# Patient Record
Sex: Male | Born: 1985 | Race: White | Hispanic: No | Marital: Married | Smoking: Never smoker
Health system: Southern US, Community
[De-identification: ages and names within clinical notes are randomized; demographics above are authoritative.]

## PROBLEM LIST (undated history)

## (undated) DIAGNOSIS — N200 Calculus of kidney: Secondary | ICD-10-CM

## (undated) DIAGNOSIS — K5792 Diverticulitis of intestine, part unspecified, without perforation or abscess without bleeding: Secondary | ICD-10-CM

## (undated) HISTORY — PX: SHOULDER SURGERY: SHX246

## (undated) HISTORY — PX: TONSILLECTOMY: SUR1361

---
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
## 2005-10-10 ENCOUNTER — Ambulatory Visit (HOSPITAL_COMMUNITY): Admission: RE | Admit: 2005-10-10 | Discharge: 2005-10-10 | Payer: Self-pay | Admitting: Specialist

## 2005-10-20 ENCOUNTER — Encounter: Admission: RE | Admit: 2005-10-20 | Discharge: 2005-10-20 | Payer: Self-pay | Admitting: Specialist

## 2008-10-10 ENCOUNTER — Emergency Department (HOSPITAL_COMMUNITY): Admission: EM | Admit: 2008-10-10 | Discharge: 2008-10-10 | Payer: Self-pay | Admitting: Emergency Medicine

## 2010-05-31 NOTE — Op Note (Signed)
[place]  Patient:    DICKEY, CAAMANO                      MRN: [id number] Proc. Date: 12/09/99 Adm. Date:  [id number] Attending:  Claudina Lick                           Operative Report  PREOPERATIVE DIAGNOSIS:  Inflammatory cyst, right upper neck.  POSTOPERATIVE DIAGNOSIS:  Inflammatory cyst, right upper neck.  PROCEDURE:  Excision of cyst, right upper neck.  SURGEON:  Robert L. Lyman Bishop, M.D.  ANESTHESIA:  Local with sedation.  INDICATION FOR PROCEDURE:  This patient was referred by Dr. Lucila Maine, his primary care physician, for evaluation of a persistent swelling in the right upper neck just below the angle of the mandible.  It was first noted about two months prior to admission.  Patient had "mashed it" and this was followed by marked swelling and tenderness and erythema when he was initially seen in the office.  He had an obvious swelling in his entire right upper neck and retromandibular area.  There were some acneiform changes of the lower facial skin on each side, and there was a 2 x 2 cm cystic mass below the angle of the right mandible with an irregular lumpy type of subcutaneous tissue surrounding the lesion.  It was felt that this probably was an infected sebaceous cyst, but due to the location directly over the parotid, I was concerned that it could have arisen from the parotid.  Therefore, a CT scan was done, which indicated that it was more superficial than the parotid, and patient was admitted for excision.  He was placed on Augmentin and by the time of admission for removal, the mass, had almost disappeared.  It was still a small cystic lesion beneath a fairly wide one-half inch scar.  DESCRIPTION OF PROCEDURE:  With the patient in the recumbent position and after IV sedation, 1% Xylocaine containing 1:100,000 epinephrine was infiltrated in the skin around the lesion and the face and neck prepped  with Betadine and sterile drapes applied.  An elliptical incision was made incorporating the previous scar and in the upper limb just as it penetrated the subcuticular area, there as a release of 1-2 cc of clear, watery fluid. The ellipse of skin was removed.  I could not identify a definite cyst wall. The subcutaneous fat in that area had a very soft, friable, chronically inflamed appearance, and all of this was excised down to the underlying superficial parotid fascia.  The superior and inferior skin flaps were widely undermined, removing all of this diseased-appearing fatty tissue until normal tissue was encountered.  Several small bleeding points were controlled with the Bovie, and the incision then was closed with interrupted 4-0 and 5-0 chromic gut placed in a subcuticular fashion, after which the skin was closed with running 5-0 Surgilon.  Estimated blood loss 1-2 cc.  A lightly compressive dressing is applied.  The patient was given 1 g Ancef IV and he is discharged to remove the dressing tomorrow and follow up in the office in one week for removal of skin sutures. DD:  12/09/99 TD:  12/09/99 Job: [id number]

## 2010-05-31 NOTE — Op Note (Signed)
[place]  Patient:    DEMONTRE, PADIN Visit Number: [id number] MRN: [id number]          Service Type: DSU Location: [facility] Attending Physician:  Claudina Lick Dictated by:   Doris Cheadle. Lyman Bishop, M.D. Proc. Date: 12/31/00 Admit Date:  12/31/2000 Discharge Date: 12/31/2000                             Operative Report  PREOPERATIVE DIAGNOSIS: 1. Chronic tonsillitis. 2. Hypertrophic scarring right upper neck, post infection.  POSTOPERATIVE DIAGNOSIS: 1. Chronic tonsillitis. 2. Hypertrophic scarring right upper neck, post infection.  OPERATION PERFORMED: 1. Tonsillectomy and adenoidectomy. 2. Scar revision.  SURGEON:  Robert L. Lyman Bishop, M.D.  ANESTHESIA:  General.  INDICATIONS FOR PROCEDURE:  This 25 year old white male has has a longstanding history of chronic recurring tonsillitis and in the office was noted to have enlarged, very cryptic tonsils and a tonsillectomy was recommended.  In addition to that, the patient had had recurring sebaceous gland infections in the skin of the right upper neck and excision of the cyst and scarring was carried out but the patient subsequently developed a hypertrophic scar of the skin of the previous incision and a revision was recommended.  DESCRIPTION OF PROCEDURE:  After satisfactory general endotracheal anesthesia had been induced 1% Xylocaine containing 1:100,000 epinephrine was infiltrated in the skin around the scar in the right upper neck.  The skin was prepped with Betadine and sterile drapes applied.  There was a 2.5 cm hypertrophic obliquely horizontal scar beneath the right angle of the mandible.  An elliptical incision was made on either side of the scarring, carried into the subcutaneous tissue.  There was extensive subcutaneous scarring found.  The scarring involving the cuticular portion was excised.  The adjacent skin was undermined with a scalpel and the incision closed  with interrupted inverted 5-0 chromic to the subcutaneous tissues with running 5-0 nylon to the skin.  A Steri-Strips dressing was applied.  A Crowe-Davis mouth gag was then inserted and suspended from the Mayo stand.  Examination showed 4+ enlarged very cryptic tonsils which were quite buried and larger than initially appeared. Also as I retracted the tonsil aside, I could see large amounts of lymphoid tissue in the nasopharynx extending down the lateral gutter on each side. This was so extensive, that I felt an adenoidectomy along with removal of that lymphoid tissue in the lateral gutter was indicated.  This was added to the operative permit, okayed by the patients mother and using curets and punch forceps, the adenoid tissue and that extending behind the posterior tonsil pillar on each side was removed and bleeding controlled with light cautery and packs.  Following this, the enlarged cryptic tonsils were incised by way of electrode dissection with coagulation of vessels, 40 mg of Depo-Medrol was infiltrated into the lower edge of the soft palate.  Observation revealed no evidence of bleeding.  The patient was given 1 gm of Ancef and 8 mg of Decadron IV intraoperatively.  Estimated blood loss for this procedure was 75 cc.  The patient tolerated the procedure well, was awakened from anesthesia and taken to the recovery room in satisfactory condition. Dictated by:   Doris Cheadle. Lyman Bishop, M.D. Attending Physician:  Claudina Lick DD:  12/31/00 TD:  01/01/01 Job: [id number]

## 2011-09-01 ENCOUNTER — Inpatient Hospital Stay (HOSPITAL_COMMUNITY)
Admission: EM | Admit: 2011-09-01 | Discharge: 2011-09-05 | DRG: 392 | Disposition: A | Discharge status: Home / Self Care | Payer: MEDICAID | Attending: Internal Medicine | Admitting: Internal Medicine

## 2011-09-01 ENCOUNTER — Encounter (HOSPITAL_COMMUNITY): Payer: Self-pay | Admitting: Emergency Medicine

## 2011-09-01 ENCOUNTER — Emergency Department (HOSPITAL_COMMUNITY): Payer: Self-pay

## 2011-09-01 DIAGNOSIS — F17200 Nicotine dependence, unspecified, uncomplicated: Secondary | ICD-10-CM | POA: Diagnosis present

## 2011-09-01 DIAGNOSIS — K5732 Diverticulitis of large intestine without perforation or abscess without bleeding: Secondary | ICD-10-CM

## 2011-09-01 DIAGNOSIS — K5792 Diverticulitis of intestine, part unspecified, without perforation or abscess without bleeding: Secondary | ICD-10-CM

## 2011-09-01 DIAGNOSIS — R1032 Left lower quadrant pain: Secondary | ICD-10-CM

## 2011-09-01 HISTORY — DX: Calculus of kidney: N20.0

## 2011-09-01 LAB — CBC WITH DIFFERENTIAL/PLATELET
Eosinophils Relative: 1 % (ref 0–5)
HCT: 45.2 % (ref 39.0–52.0)
Hemoglobin: 15.9 g/dL (ref 13.0–17.0)
Lymphocytes Relative: 13 % (ref 12–46)
MCHC: 35.2 g/dL (ref 30.0–36.0)
MCV: 88.5 fL (ref 78.0–100.0)
Monocytes Absolute: 1.8 10*3/uL — ABNORMAL HIGH (ref 0.1–1.0)
Monocytes Relative: 8 % (ref 3–12)
Neutro Abs: 17 10*3/uL — ABNORMAL HIGH (ref 1.7–7.7)
WBC: 21.7 10*3/uL — ABNORMAL HIGH (ref 4.0–10.5)

## 2011-09-01 LAB — URINALYSIS, ROUTINE W REFLEX MICROSCOPIC
Bilirubin Urine: NEGATIVE
Hgb urine dipstick: NEGATIVE
Specific Gravity, Urine: 1.014 (ref 1.005–1.030)
pH: 7.5 (ref 5.0–8.0)

## 2011-09-01 LAB — COMPREHENSIVE METABOLIC PANEL
ALT: 17 U/L (ref 0–53)
Albumin: 4.1 g/dL (ref 3.5–5.2)
Calcium: 10.1 mg/dL (ref 8.4–10.5)
GFR calc Af Amer: >90 mL/min (ref >90)
Glucose, Bld: 114 mg/dL — ABNORMAL HIGH (ref 70–99)
Potassium: 4 mEq/L (ref 3.5–5.1)
Sodium: 138 mEq/L (ref 135–145)
Total Protein: 7.8 g/dL (ref 6.0–8.3)

## 2011-09-01 MED ORDER — ONDANSETRON HCL 4 MG/2ML IJ SOLN
4.0000 mg | Freq: Once | INTRAMUSCULAR | Status: AC
  Administered 2011-09-01: 4 mg via INTRAVENOUS
  Filled 2011-09-01: qty 2

## 2011-09-01 MED ORDER — FAMOTIDINE IN NACL 20-0.9 MG/50ML-% IV SOLN
20.0000 mg | Freq: Once | INTRAVENOUS | Status: AC
  Administered 2011-09-01: 20 mg via INTRAVENOUS
  Filled 2011-09-01: qty 50

## 2011-09-01 MED ORDER — METRONIDAZOLE IN NACL 5-0.79 MG/ML-% IV SOLN
500.0000 mg | Freq: Once | INTRAVENOUS | Status: AC
  Administered 2011-09-02: 500 mg via INTRAVENOUS
  Filled 2011-09-01: qty 100

## 2011-09-01 MED ORDER — CIPROFLOXACIN IN D5W 400 MG/200ML IV SOLN
400.0000 mg | Freq: Once | INTRAVENOUS | Status: AC
  Administered 2011-09-01: 400 mg via INTRAVENOUS
  Filled 2011-09-01: qty 200

## 2011-09-01 MED ORDER — IOHEXOL 300 MG/ML  SOLN
100.0000 mL | Freq: Once | INTRAMUSCULAR | Status: AC | PRN
  Administered 2011-09-01: 100 mL via INTRAVENOUS

## 2011-09-01 MED ORDER — MORPHINE SULFATE 4 MG/ML IJ SOLN
4.0000 mg | INTRAMUSCULAR | Status: DC | PRN
  Administered 2011-09-01 – 2011-09-04 (×9): 4 mg via INTRAVENOUS
  Filled 2011-09-01 (×9): qty 1

## 2011-09-01 NOTE — ED Provider Notes (Addendum)
History     CSN: [id number]  Arrival date & time 09/01/11  1941   None     Chief Complaint  Patient presents with  . Abdominal Pain    (Consider location/radiation/quality/duration/timing/severity/associated sxs/prior treatment) HPI 26 year old man without any significant past medical history comes with left lower quadrant pain for the last 24 hours. He was seated watching TV when the pain suddenly started and has been progressively worsening over the last 24 hours. He describes the pain as sharp, constant, severe to the scale of 9/10, radiating down to his left groin. The pain is increased by movement. It is reduced by lying still on his back and drawing his knees up. His appetite has reduced and he had one episode of large vomiting. He has not had a bowel movement for the last 2 days, but he reports passing flatus. No history of rectal bleeding. However, the patient reports passing dark colored urine without hematuria. He denies dysuria, or increased frequency. The patient does not admit to a history of trauma to his abdomen. He reports a history of fever and chills. He has a history of a left kidney stone, which she passed spontaneously 4 years ago.  Past Medical History  Diagnosis Date  . Kidney stone     No past surgical history on file.  No family history on file.  History  Substance Use Topics  . Smoking status: Not on file  . Smokeless tobacco: Not on file  . Alcohol Use: Not on file      Review of Systems  Constitutional: Positive for diaphoresis.  HENT: Negative.   Eyes: Negative.   Respiratory: Negative.   Cardiovascular: Negative.   Gastrointestinal: Positive for nausea. Negative for diarrhea, blood in stool, abdominal distention, anal bleeding and rectal pain.  Genitourinary: Negative for dysuria, urgency, frequency, hematuria, flank pain, decreased urine volume, discharge, penile swelling, scrotal swelling, enuresis, difficulty urinating, genital sores, penile  pain and testicular pain.  Musculoskeletal: Negative.   Skin: Negative.   Neurological: Negative.   Psychiatric/Behavioral: Negative.     Allergies  Review of patient's allergies indicates no known allergies.  Home Medications  No current outpatient prescriptions on file.  BP 127/76  Pulse 93  Temp 99.3 F (37.4 C) (Oral)  Resp 18  SpO2 99%  Physical Exam  Constitutional: He is oriented to person, place, and time. He appears well-developed and well-nourished. He appears distressed.  HENT:  Head: Normocephalic and atraumatic.  Eyes: Conjunctivae and EOM are normal. Pupils are equal, round, and reactive to light.  Neck: Normal range of motion. Neck supple.  Cardiovascular: Normal rate, regular rhythm and intact distal pulses.   No murmur heard. Pulmonary/Chest: Effort normal and breath sounds normal. No respiratory distress. He has no wheezes. He has no rales. He exhibits no tenderness.  Abdominal: Soft. Bowel sounds are normal. He exhibits no shifting dullness, no distension, no pulsatile liver, no fluid wave, no abdominal bruit, no ascites and no mass. There is no hepatosplenomegaly, splenomegaly or hepatomegaly. There is tenderness in the left lower quadrant. There is rebound and guarding. There is no rigidity, no CVA tenderness, no tenderness at McBurney's point and negative Murphy's sign. No hernia. Hernia confirmed negative in the ventral area, confirmed negative in the right inguinal area and confirmed negative in the left inguinal area.    Genitourinary: Testes normal and penis normal. Right testis shows no swelling and no tenderness. Right testis is descended. Cremasteric reflex is not absent on the right side. Left  testis shows no mass and no swelling. Left testis is descended. Cremasteric reflex is not absent on the left side. Circumcised.  Musculoskeletal: Normal range of motion.  Neurological: He is alert and oriented to person, place, and time. He has normal reflexes.    Skin: Skin is warm and dry. He is not diaphoretic.  Psychiatric: He has a normal mood and affect.    ED Course  Procedures (including critical care time)  Labs Reviewed  COMPREHENSIVE METABOLIC PANEL - Abnormal; Notable for the following:    Glucose, Bld 114 (*)     All other components within normal limits  CBC WITH DIFFERENTIAL - Abnormal; Notable for the following:    WBC 21.7 (*)     Neutrophils Relative 78 (*)     Neutro Abs 17.0 (*)     Monocytes Absolute 1.8 (*)     All other components within normal limits  URINALYSIS, ROUTINE W REFLEX MICROSCOPIC   Ct Abdomen Pelvis W Contrast  09/01/2011  *RADIOLOGY REPORT*  Clinical Data: Left lower quadrant pain.  CT ABDOMEN AND PELVIS WITH CONTRAST  Technique:  Multidetector CT imaging of the abdomen and pelvis was performed following the standard protocol during bolus administration of intravenous contrast.  Contrast: OMNIPAQUE IOHEXOL 300 MG/ML  SOLN  Comparison: CT 10/23/2008  Findings: Diverticular changes in the left colon.  There is thickening of the lower left colon with stranding in the pericolonic fat.  In addition, there are several extraluminal gas bubbles posterior to the lower descending colon compatible with micro perforation.  Negative for abscess.  No free fluid.  Mild left lower lobe atelectasis.  No pleural effusion.  Liver and gallbladder are normal.  Pancreas spleen and kidneys are normal.  Negative for bowel obstruction.  Appendix not visualized.  IMPRESSION: Acute colitis in the lower left colon compatible with diverticulitis.  There is stranding in the pericolonic fat as well as extraluminal gas bubbles compatible with micro perforation. Negative for abscess.   Original Report Authenticated By: Camelia Phenes, M.D.      No diagnosis found.    MDM  This is a 26 year old, healthy man, who comes in with left lower quadrant pain for the past 24 hours. Associated symptoms include vomiting, fever and chills. He has a  significant past medical history of a kidney stone on the left side which he passed spontenously. His been referred from [facility] with an elevated white blood cell count of 21.7 and a neutrophilia of 78%. Physical findings include a temperature of 99.3 and significant left lower quadrant tenderness and guarding, with a normal genital examination. Clinical history and physical exam seem to point to a left kidney stone versus colitis. I have ordered Morphine and Zofran. Basic labs have been ordered including a CBC, urinalysis, comprehensive metabolic panel. Apparently, an abdominal CT scan with contrast has also been ordered.  Noted the results of her CT scan and which shows acute colitis with microperforation.   Internal medicine service has been consulted for admission.   Dow Adolph, MD 09/01/11 2300  11:52 PM  Discussed the patient with surgery (Dr Lindie Spruce) who agrees evaluate the patient. Medicine can admit.  Dow Adolph, MD 09/01/11 [phone]

## 2011-09-01 NOTE — ED Notes (Signed)
Pt. Reports abdominal pain starting last night. States N/V this morning. Denies problems with urination, states last BM was Friday.

## 2011-09-01 NOTE — ED Notes (Signed)
Pt states he hasn't had a BM since Friday, 8/16

## 2011-09-01 NOTE — ED Provider Notes (Signed)
I saw and evaluated the patient, reviewed the resident's note and I agree with the findings and plan.  Pt with llq ttp.  Mild guarding and rebound but in no distress. Pt  With a complicated diverticulitis.  Will admit to the hospital for IV abx.    Celene Kras, MD 09/01/11 [phone]

## 2011-09-01 NOTE — ED Notes (Signed)
Sent from [place] urgent care for elevated WBC (18) LLQ abdominal tenderness when palpated.

## 2011-09-02 ENCOUNTER — Encounter (HOSPITAL_COMMUNITY): Payer: Self-pay | Admitting: Internal Medicine

## 2011-09-02 DIAGNOSIS — K5732 Diverticulitis of large intestine without perforation or abscess without bleeding: Principal | ICD-10-CM

## 2011-09-02 DIAGNOSIS — K5792 Diverticulitis of intestine, part unspecified, without perforation or abscess without bleeding: Secondary | ICD-10-CM

## 2011-09-02 LAB — CBC
MCH: 30.4 pg (ref 26.0–34.0)
MCV: 89.9 fL (ref 78.0–100.0)
Platelets: 160 10*3/uL (ref 150–400)
RBC: 4.44 MIL/uL (ref 4.22–5.81)
RDW: 13.2 % (ref 11.5–15.5)

## 2011-09-02 LAB — COMPREHENSIVE METABOLIC PANEL
ALT: 11 U/L (ref 0–53)
AST: 11 U/L (ref 0–37)
Albumin: 3.1 g/dL — ABNORMAL LOW (ref 3.5–5.2)
Alkaline Phosphatase: 58 U/L (ref 39–117)
Chloride: 102 mEq/L (ref 96–112)
Potassium: 3.9 mEq/L (ref 3.5–5.1)
Sodium: 136 mEq/L (ref 135–145)
Total Bilirubin: 0.9 mg/dL (ref 0.3–1.2)

## 2011-09-02 MED ORDER — MOXIFLOXACIN HCL IN NACL 400 MG/250ML IV SOLN
400.0000 mg | INTRAVENOUS | Status: DC
  Administered 2011-09-02: 400 mg via INTRAVENOUS
  Filled 2011-09-02: qty 250

## 2011-09-02 MED ORDER — METRONIDAZOLE IN NACL 5-0.79 MG/ML-% IV SOLN
500.0000 mg | Freq: Three times a day (TID) | INTRAVENOUS | Status: DC
  Administered 2011-09-02 – 2011-09-05 (×10): 500 mg via INTRAVENOUS
  Filled 2011-09-02 (×13): qty 100

## 2011-09-02 MED ORDER — CIPROFLOXACIN IN D5W 400 MG/200ML IV SOLN
400.0000 mg | Freq: Two times a day (BID) | INTRAVENOUS | Status: DC
  Administered 2011-09-02 – 2011-09-04 (×6): 400 mg via INTRAVENOUS
  Filled 2011-09-02 (×8): qty 200

## 2011-09-02 MED ORDER — SODIUM CHLORIDE 0.9 % IV SOLN
INTRAVENOUS | Status: DC
  Administered 2011-09-02 (×3): via INTRAVENOUS

## 2011-09-02 MED ORDER — SODIUM CHLORIDE 0.9 % IJ SOLN
3.0000 mL | Freq: Two times a day (BID) | INTRAMUSCULAR | Status: DC
  Administered 2011-09-02 – 2011-09-03 (×2): 3 mL via INTRAVENOUS

## 2011-09-02 NOTE — Progress Notes (Signed)
Pt admitted to the unit. Pt is alert and oriented. Pt oriented to room, staff, and call bell. Bed in lowest position. Full assessment to Epic. Call bell with in reach. Told to call for assists. Will continue to monitor.  Demarrio Menges E  

## 2011-09-02 NOTE — Progress Notes (Signed)
[facility] PROGRESS NOTE  Angel Rubio ZOX:[id number] DOB: 08-25-1985 DOA: 09/01/2011 PCP: No primary provider on file.  Assessment/Plan: Active Problems:  Diverticulitis  Acute diverticulitis with microperforation-plan to continue IV antibiotics with ciprofloxacin and Flagyl. Diet per surgery. Code Status: Full Family Communication: Mother at bedside Disposition Plan: Back home    Consultants:  [facility]  Procedures:  CT scan abdomen and pelvis  Antibiotics:  Cipro August 19  Flagyl August 19  HPI/Subjective: Pain is slightly better  Objective: Filed Vitals:   09/02/11 0024 09/02/11 0222 09/02/11 0536 09/02/11 1324  BP: 110/63 130/62 100/61 104/58  Pulse: 89 79 81 82  Temp: 99.8 F (37.7 C) 99.7 F (37.6 C) 99.8 F (37.7 C) 99 F (37.2 C)  TempSrc: Oral Oral Oral Oral  Resp: 14 18 20 18   Height:  6\' 1"  (1.854 m)    Weight:  103.4 kg (227 lb 15.3 oz)    SpO2: 97% 94% 94% 96%    Intake/Output Summary (Last 24 hours) at 09/02/11 1438 Last data filed at 09/02/11 1300  Gross per 24 hour  Intake      0 ml  Output      0 ml  Net      0 ml   Filed Weights   09/02/11 0222  Weight: 103.4 kg (227 lb 15.3 oz)    Exam:   General:  Alert and oriented x3  Cardiovascular: Regular rate and rhythm  Respiratory: Clear to auscultation bilaterally  Abdomen: Soft, significant left lower quadrant tenderness  Data Reviewed: Basic Metabolic Panel:  Lab 09/02/11 8119 09/01/11 2005  NA 136 138  K 3.9 4.0  CL 102 100  CO2 27 27  GLUCOSE 102* 114*  BUN 10 10  CREATININE 1.15 1.01  CALCIUM 9.0 10.1  MG -- --  PHOS -- --   Liver Function Tests:  Lab 09/02/11 0645 09/01/11 2005  AST 11 13  ALT 11 17  ALKPHOS 58 74  BILITOT 0.9 1.1  PROT 6.2 7.8  ALBUMIN 3.1* 4.1   No results found for this basename: LIPASE:5,AMYLASE:5 in the last 168 hours No results found for this basename: AMMONIA:5 in the last 168 hours CBC:  Lab  09/02/11 0645 09/01/11 2005  WBC 14.0* 21.7*  NEUTROABS -- 17.0*  HGB 13.5 15.9  HCT 39.9 45.2  MCV 89.9 88.5  PLT 160 202   Cardiac Enzymes: No results found for this basename: CKTOTAL:5,CKMB:5,CKMBINDEX:5,TROPONINI:5 in the last 168 hours BNP (last 3 results) No results found for this basename: PROBNP:3 in the last 8760 hours CBG: No results found for this basename: GLUCAP:5 in the last 168 hours  No results found for this or any previous visit (from the past 240 hour(s)).   Studies: Ct Abdomen Pelvis W Contrast  09/01/2011  *RADIOLOGY REPORT*  Clinical Data: Left lower quadrant pain.  CT ABDOMEN AND PELVIS WITH CONTRAST  Technique:  Multidetector CT imaging of the abdomen and pelvis was performed following the standard protocol during bolus administration of intravenous contrast.  Contrast: OMNIPAQUE IOHEXOL 300 MG/ML  SOLN  Comparison: CT 10/23/2008  Findings: Diverticular changes in the left colon.  There is thickening of the lower left colon with stranding in the pericolonic fat.  In addition, there are several extraluminal gas bubbles posterior to the lower descending colon compatible with micro perforation.  Negative for abscess.  No free fluid.  Mild left lower lobe atelectasis.  No pleural effusion.  Liver and gallbladder are normal.  Pancreas  spleen and kidneys are normal.  Negative for bowel obstruction.  Appendix not visualized.  IMPRESSION: Acute colitis in the lower left colon compatible with diverticulitis.  There is stranding in the pericolonic fat as well as extraluminal gas bubbles compatible with micro perforation. Negative for abscess.   Original Report Authenticated By: Camelia Phenes, M.D.     Scheduled Meds:    . ciprofloxacin  400 mg Intravenous Once  . ciprofloxacin  400 mg Intravenous Q12H  . famotidine (PEPCID) IV  20 mg Intravenous Once  . metronidazole  500 mg Intravenous Once  . metronidazole  500 mg Intravenous Q8H  . ondansetron (ZOFRAN) IV  4 mg  Intravenous Once  . sodium chloride  3 mL Intravenous Q12H  . DISCONTD: moxifloxacin  400 mg Intravenous Q24H   Continuous Infusions:    . sodium chloride 125 mL/hr at 09/02/11 1119    Active Problems:  Diverticulitis    Time spent: 20 minutes    Indiah Heyden  [facility] Pager [phone]. If 8PM-8AM, please contact night-coverage at [email or web address], password [facility] 09/02/2011, 2:38 PM  LOS: 1 day

## 2011-09-02 NOTE — ED Notes (Signed)
Admitting physician at bedside

## 2011-09-02 NOTE — Consult Note (Signed)
Reason for Consult:Acute diverticulitis Referring Physician: Roben Schliep is an 26 y.o. male.  HPI: 4 day history of constipation and abdominal pain.  Has had this pain before, but thought it was a pulled muscle.  This has been getting worse since Friday.  Past Medical History  Diagnosis Date  . Kidney stone     Past Surgical History  Procedure Date  . Shoulder surgery     Right  . Salivary gland surgery 2001    Biopsy=>negative  . Tonsillectomy     Family History  Problem Relation Age of Onset  . Crohn's disease Mother   . Hypertension Father     Social History:  reports that he has never smoked. His smokeless tobacco use includes Chew. He reports that he drinks about 3 ounces of alcohol per week. He reports that he does not use illicit drugs.  Allergies: No Known Allergies  Medications: I have reviewed the patient's current medications.  Results for orders placed during the hospital encounter of 09/01/11 (from the past 48 hour(s))  COMPREHENSIVE METABOLIC PANEL     Status: Abnormal   Collection Time   09/01/11  8:05 PM      Component Value Range Comment   Sodium 138  135 - 145 mEq/L    Potassium 4.0  3.5 - 5.1 mEq/L    Chloride 100  96 - 112 mEq/L    CO2 27  19 - 32 mEq/L    Glucose, Bld 114 (*) 70 - 99 mg/dL    BUN 10  6 - 23 mg/dL    Creatinine, Ser 1.61  0.50 - 1.35 mg/dL    Calcium 09.6  8.4 - 10.5 mg/dL    Total Protein 7.8  6.0 - 8.3 g/dL    Albumin 4.1  3.5 - 5.2 g/dL    AST 13  0 - 37 U/L    ALT 17  0 - 53 U/L    Alkaline Phosphatase 74  39 - 117 U/L    Total Bilirubin 1.1  0.3 - 1.2 mg/dL    GFR calc non Af Amer >90  >90 mL/min    GFR calc Af Amer >90  >90 mL/min   CBC WITH DIFFERENTIAL     Status: Abnormal   Collection Time   09/01/11  8:05 PM      Component Value Range Comment   WBC 21.7 (*) 4.0 - 10.5 K/uL    RBC 5.11  4.22 - 5.81 MIL/uL    Hemoglobin 15.9  13.0 - 17.0 g/dL    HCT 04.5  40.9 - 81.1 %    MCV 88.5  78.0 - 100.0 fL    MCH 31.1  26.0 - 34.0 pg    MCHC 35.2  30.0 - 36.0 g/dL    RDW 91.4  78.2 - 95.6 %    Platelets 202  150 - 400 K/uL    Neutrophils Relative 78 (*) 43 - 77 %    Neutro Abs 17.0 (*) 1.7 - 7.7 K/uL    Lymphocytes Relative 13  12 - 46 %    Lymphs Abs 2.8  0.7 - 4.0 K/uL    Monocytes Relative 8  3 - 12 %    Monocytes Absolute 1.8 (*) 0.1 - 1.0 K/uL    Eosinophils Relative 1  0 - 5 %    Eosinophils Absolute 0.1  0.0 - 0.7 K/uL    Basophils Relative 0  0 - 1 %    Basophils  Absolute 0.0  0.0 - 0.1 K/uL   URINALYSIS, ROUTINE W REFLEX MICROSCOPIC     Status: Normal   Collection Time   09/01/11 10:47 PM      Component Value Range Comment   Color, Urine YELLOW  YELLOW    APPearance CLEAR  CLEAR    Specific Gravity, Urine 1.014  1.005 - 1.030    pH 7.5  5.0 - 8.0    Glucose, UA NEGATIVE  NEGATIVE mg/dL    Hgb urine dipstick NEGATIVE  NEGATIVE    Bilirubin Urine NEGATIVE  NEGATIVE    Ketones, ur NEGATIVE  NEGATIVE mg/dL    Protein, ur NEGATIVE  NEGATIVE mg/dL    Urobilinogen, UA 1.0  0.0 - 1.0 mg/dL    Nitrite NEGATIVE  NEGATIVE    Leukocytes, UA NEGATIVE  NEGATIVE MICROSCOPIC NOT DONE ON URINES WITH NEGATIVE PROTEIN, BLOOD, LEUKOCYTES, NITRITE, OR GLUCOSE <1000 mg/dL.    Ct Abdomen Pelvis W Contrast  09/01/2011  *RADIOLOGY REPORT*  Clinical Data: Left lower quadrant pain.  CT ABDOMEN AND PELVIS WITH CONTRAST  Technique:  Multidetector CT imaging of the abdomen and pelvis was performed following the standard protocol during bolus administration of intravenous contrast.  Contrast: OMNIPAQUE IOHEXOL 300 MG/ML  SOLN  Comparison: CT 10/23/2008  Findings: Diverticular changes in the left colon.  There is thickening of the lower left colon with stranding in the pericolonic fat.  In addition, there are several extraluminal gas bubbles posterior to the lower descending colon compatible with micro perforation.  Negative for abscess.  No free fluid.  Mild left lower lobe atelectasis.  No pleural  effusion.  Liver and gallbladder are normal.  Pancreas spleen and kidneys are normal.  Negative for bowel obstruction.  Appendix not visualized.  IMPRESSION: Acute colitis in the lower left colon compatible with diverticulitis.  There is stranding in the pericolonic fat as well as extraluminal gas bubbles compatible with micro perforation. Negative for abscess.   Original Report Authenticated By: Camelia Phenes, M.D.     Review of Systems  Constitutional: Positive for fever and chills.  HENT: Negative.   Eyes: Negative.   Respiratory: Negative.   Cardiovascular: Negative.   Gastrointestinal: Positive for nausea, vomiting, constipation and blood in stool.  Skin: Negative.   Neurological: Negative.   Endo/Heme/Allergies: Negative.   Psychiatric/Behavioral: Negative.    Blood pressure 110/63, pulse 89, temperature 99.8 F (37.7 C), temperature source Oral, resp. rate 14, SpO2 97.00%. Physical Exam  Constitutional: He is oriented to person, place, and time. He appears well-developed and well-nourished.  HENT:  Head: Normocephalic and atraumatic.  Eyes: Conjunctivae and EOM are normal. Pupils are equal, round, and reactive to light.  Neck: Normal range of motion. Neck supple.  Cardiovascular: Normal rate, regular rhythm and normal heart sounds.   Respiratory: Effort normal and breath sounds normal.  GI: Soft. Bowel sounds are normal. There is tenderness in the left lower quadrant. There is rebound and guarding.  Genitourinary: Penis normal.  Musculoskeletal: Normal range of motion.  Neurological: He is alert and oriented to person, place, and time.  Skin: Skin is warm.  Psychiatric: He has a normal mood and affect. His behavior is normal. Judgment and thought content normal.    Assessment/Plan: Acute diverticulitis with microperforation and significant diverticulosis of the descending and sigmoid colon. Patient has been admitted by medicine.  He does not require an operation at this  time, but we will follow him in the hospital for improvement.  I  would not be surprised if he did not require colectomy sometime in the future because of the extent of his disease.  Cherylynn Ridges 09/02/2011, 1:45 AM

## 2011-09-02 NOTE — Plan of Care (Signed)
Problem: Food- and Nutrition-Related Knowledge Deficit (NB-1.1) Goal: Nutrition education Formal process to instruct or train a patient/client in a skill or to impart knowledge to help patients/clients voluntarily manage or modify food choices and eating behavior to maintain or improve health.  Outcome: Completed/Met Date Met:  09/02/11 RD consulted for diet education for new onset diverticular dz. RD provided pt with "Low Fiber Nutrition Therapy" hand out from the [employer]. RD went over foods to avoid and foods to choose more often. Pt verbalized understanding. No questions.  Current diet is NPO.  RD reviewed chart, no additional nutrition interventions at this time.   Please re-consult if needed  Clarene Duke RD, LDN Pager [phone] After Hours pager [phone]

## 2011-09-02 NOTE — H&P (Signed)
[facility] History and Physical  Angel Rubio YNW:[id number] DOB: Nov 25, 1985 DOA: 09/01/2011  Referring physician: Lynelle Doctor PCP: No primary provider on file.   Chief Complaint: abdominal pain  HPI:  26 year old man without any significant past medical history comes with left lower quadrant pain for the last 24 hours. He was seated watching TV when the pain suddenly started and has been progressively worsening over the last 24 hours. He describes the pain as sharp, constant, severe to the scale of 9/10, radiating down to his left groin. The pain is increased by movement. It is reduced by lying still on his back and drawing his knees up. His appetite has reduced and he had one episode of large vomiting. He has not had a bowel movement for the last 2 days, but he reports passing flatus. No history of rectal bleeding. However, the patient reports passing dark colored urine without hematuria. He denies dysuria, or increased frequency. The patient does not admit to a history of trauma to his abdomen. He reports a history of fever and chills. He has a history of a left kidney stone, which she passed spontaneously 4 years ago.  In ED CT scan abd/pelvis =>diverticulitis with microperforation.   Pt placed on iv flagyl and iv avelox.  Surgery has been consulted by Ed. ,  Appreciate their input.   Review of Systems:  NEGATIVE FOR ALL ORGAN SYSTEMS EXCEPT FOR + ABOVE  Past Medical History  Diagnosis Date  . Kidney stone    Past Surgical History  Procedure Date  . Shoulder surgery     Right  . Salivary gland surgery 2001    Biopsy=>negative  . Tonsillectomy    Social History:  reports that he has never smoked. His smokeless tobacco use includes Chew. He reports that he drinks about 3 ounces of alcohol per week. He reports that he does not use illicit drugs. Lives at home,  Able to perform all ADLS No Known Allergies  Family History  Problem Relation Age of Onset  . Crohn's disease  Mother   . Hypertension Father     (be sure to complete)  Prior to Admission medications   Not on File   Physical Exam: Filed Vitals:   09/01/11 1945 09/02/11 0024  BP: 127/76 110/63  Pulse: 93 89  Temp: 99.3 F (37.4 C) 99.8 F (37.7 C)  TempSrc: Oral Oral  Resp: 18 14  SpO2: 99% 97%     General:  Young white male  Eyes: anicteric  ENT: mmm  Neck: no jvd, no bruit, no tm, no adenoapthy  Cardiovascular: rrr s1, s2  Respiratory: ctab  Abdomen: soft, sligthly tender in the left lower quadrant  Skin: no rash, negative cullens sign  Musculoskeletal: wnl  Psychiatric: axox3  Neurologic: nonfocal  Labs on Admission:  Basic Metabolic Panel:  Lab 09/01/11 6578  NA 138  K 4.0  CL 100  CO2 27  GLUCOSE 114*  BUN 10  CREATININE 1.01  CALCIUM 10.1  MG --  PHOS --   Liver Function Tests:  Lab 09/01/11 2005  AST 13  ALT 17  ALKPHOS 74  BILITOT 1.1  PROT 7.8  ALBUMIN 4.1   No results found for this basename: LIPASE:5,AMYLASE:5 in the last 168 hours No results found for this basename: AMMONIA:5 in the last 168 hours CBC:  Lab 09/01/11 2005  WBC 21.7*  NEUTROABS 17.0*  HGB 15.9  HCT 45.2  MCV 88.5  PLT 202   Cardiac Enzymes: No results  found for this basename: CKTOTAL:5,CKMB:5,CKMBINDEX:5,TROPONINI:5 in the last 168 hours  BNP (last 3 results) No results found for this basename: PROBNP:3 in the last 8760 hours CBG: No results found for this basename: GLUCAP:5 in the last 168 hours  Radiological Exams on Admission: Ct Abdomen Pelvis W Contrast  09/01/2011  *RADIOLOGY REPORT*  Clinical Data: Left lower quadrant pain.  CT ABDOMEN AND PELVIS WITH CONTRAST  Technique:  Multidetector CT imaging of the abdomen and pelvis was performed following the standard protocol during bolus administration of intravenous contrast.  Contrast: OMNIPAQUE IOHEXOL 300 MG/ML  SOLN  Comparison: CT 10/23/2008  Findings: Diverticular changes in the left colon.  There  is thickening of the lower left colon with stranding in the pericolonic fat.  In addition, there are several extraluminal gas bubbles posterior to the lower descending colon compatible with micro perforation.  Negative for abscess.  No free fluid.  Mild left lower lobe atelectasis.  No pleural effusion.  Liver and gallbladder are normal.  Pancreas spleen and kidneys are normal.  Negative for bowel obstruction.  Appendix not visualized.  IMPRESSION: Acute colitis in the lower left colon compatible with diverticulitis.  There is stranding in the pericolonic fat as well as extraluminal gas bubbles compatible with micro perforation. Negative for abscess.   Original Report Authenticated By: Camelia Phenes, M.D.     EKG:  Assessment/Plan Active Problems:  Diverticulitis   1. Diverticulitis with microperforation,  NSiv, NPO, Iv avelox, iv flagyl,  Surgery consult  Code Status: Full Family Communication:mother, please Disposition Plan: home  Time spent: 45 minutes  Pearson Grippe [facility] Pager [phone]  If 7PM-7AM, please contact night-coverage [email or web address] Password [facility] 09/02/2011, 12:51 AM

## 2011-09-02 NOTE — Progress Notes (Signed)
Still quite tender RLQ.  Hope to treat non-operatively.  Continue bowel rest and IV ABX. Will follow closely.  I spoke to the patient about the disease process. Patient examined and I agree with the assessment and plan  Violeta Gelinas, MD, MPH, FACS Pager: [phone]  09/02/2011 11:13 AM

## 2011-09-02 NOTE — Progress Notes (Signed)
Patient ID: Angel Rubio, male   DOB: 30-Jan-1985, 26 y.o.   MRN: [id number]    Subjective: Pt reports abd pain about the same.  Denies fevers, chills, nausea or vomiting.  No problems otherwise.  Objective: Vital signs in last 24 hours: Temp:  [99.3 F (37.4 C)-99.8 F (37.7 C)] 99.8 F (37.7 C) (08/20 0536) Pulse Rate:  [79-93] 81  (08/20 0536) Resp:  [14-20] 20  (08/20 0536) BP: (100-130)/(61-76) 100/61 mmHg (08/20 0536) SpO2:  [94 %-99 %] 94 % (08/20 0536) Weight:  [227 lb 15.3 oz (103.4 kg)] 227 lb 15.3 oz (103.4 kg) (08/20 0222) Last BM Date: 08/30/11  Intake/Output from previous day:   Intake/Output this shift:    PE:  General: awake, alert, no acute distress Heart: RRR Lungs: CTA bilateral Abd: soft, tender esp left side, +bs Ext: warm, well perfused  Lab Results:   Basename 09/02/11 0645 09/01/11 2005  WBC 14.0* 21.7*  HGB 13.5 15.9  HCT 39.9 45.2  PLT 160 202   BMET  Basename 09/02/11 0645 09/01/11 2005  NA 136 138  K 3.9 4.0  CL 102 100  CO2 27 27  GLUCOSE 102* 114*  BUN 10 10  CREATININE 1.15 1.01  CALCIUM 9.0 10.1   PT/INR  Basename 09/02/11 0645  LABPROT 15.4*  INR 1.19   CMP     Component Value Date/Time   NA 136 09/02/2011 0645   K 3.9 09/02/2011 0645   CL 102 09/02/2011 0645   CO2 27 09/02/2011 0645   GLUCOSE 102* 09/02/2011 0645   BUN 10 09/02/2011 0645   CREATININE 1.15 09/02/2011 0645   CALCIUM 9.0 09/02/2011 0645   PROT 6.2 09/02/2011 0645   ALBUMIN 3.1* 09/02/2011 0645   AST 11 09/02/2011 0645   ALT 11 09/02/2011 0645   ALKPHOS 58 09/02/2011 0645   BILITOT 0.9 09/02/2011 0645   GFRNONAA 86* 09/02/2011 0645   GFRAA >90 09/02/2011 0645   Lipase  No results found for this basename: lipase       Studies/Results: Ct Abdomen Pelvis W Contrast  09/01/2011  *RADIOLOGY REPORT*  Clinical Data: Left lower quadrant pain.  CT ABDOMEN AND PELVIS WITH CONTRAST  Technique:  Multidetector CT imaging of the abdomen and pelvis was performed  following the standard protocol during bolus administration of intravenous contrast.  Contrast: OMNIPAQUE IOHEXOL 300 MG/ML  SOLN  Comparison: CT 10/23/2008  Findings: Diverticular changes in the left colon.  There is thickening of the lower left colon with stranding in the pericolonic fat.  In addition, there are several extraluminal gas bubbles posterior to the lower descending colon compatible with micro perforation.  Negative for abscess.  No free fluid.  Mild left lower lobe atelectasis.  No pleural effusion.  Liver and gallbladder are normal.  Pancreas spleen and kidneys are normal.  Negative for bowel obstruction.  Appendix not visualized.  IMPRESSION: Acute colitis in the lower left colon compatible with diverticulitis.  There is stranding in the pericolonic fat as well as extraluminal gas bubbles compatible with micro perforation. Negative for abscess.   Original Report Authenticated By: Camelia Phenes, M.D.     Anti-infectives: Anti-infectives     Start     Dose/Rate Route Frequency Ordered Stop   09/02/11 0800   metroNIDAZOLE (FLAGYL) IVPB 500 mg        500 mg 100 mL/hr over 60 Minutes Intravenous Every 8 hours 09/02/11 0214     09/02/11 0300   moxifloxacin (AVELOX) IVPB  400 mg        400 mg 250 mL/hr over 60 Minutes Intravenous Every 24 hours 09/02/11 0214     09/01/11 2315   metroNIDAZOLE (FLAGYL) IVPB 500 mg        500 mg 100 mL/hr over 60 Minutes Intravenous  Once 09/01/11 2302 09/02/11 0118   09/01/11 2315   ciprofloxacin (CIPRO) IVPB 400 mg        400 mg 200 mL/hr over 60 Minutes Intravenous  Once 09/01/11 2302 09/02/11 0014           Assessment/Plan  1.  Diverticulitis with microperforation: no surgical intervention at this time but likely will need colectomy in future, continue management with bowel rest and IV antibiotics.  Will consult dietician to counsel patient about diet.  Will follow.   LOS: 1 day    Ticara Waner 09/02/2011

## 2011-09-03 LAB — CBC
HCT: 39.4 % (ref 39.0–52.0)
Hemoglobin: 13.7 g/dL (ref 13.0–17.0)
MCH: 31.2 pg (ref 26.0–34.0)
MCHC: 34.8 g/dL (ref 30.0–36.0)
MCV: 89.7 fL (ref 78.0–100.0)
Platelets: 176 10*3/uL (ref 150–400)
RBC: 4.39 MIL/uL (ref 4.22–5.81)
RDW: 12.9 % (ref 11.5–15.5)
WBC: 14.3 10*3/uL — ABNORMAL HIGH (ref 4.0–10.5)

## 2011-09-03 LAB — BASIC METABOLIC PANEL
BUN: 11 mg/dL (ref 6–23)
CO2: 25 mEq/L (ref 19–32)
Calcium: 9.2 mg/dL (ref 8.4–10.5)
Chloride: 97 mEq/L (ref 96–112)
Creatinine, Ser: 1.1 mg/dL (ref 0.50–1.35)
GFR calc Af Amer: >90 mL/min (ref >90)
GFR calc non Af Amer: >90 mL/min (ref >90)
Glucose, Bld: 97 mg/dL (ref 70–99)
Potassium: 3.5 mEq/L (ref 3.5–5.1)
Sodium: 135 mEq/L (ref 135–145)

## 2011-09-03 NOTE — Progress Notes (Signed)
Patient ID: Angel Rubio, male   DOB: 09-03-1985, 26 y.o.   MRN: [id number]    Subjective: Pt reports pain a little better today, no other changes, denies fevers/chills.  Objective: Vital signs in last 24 hours: Temp:  [99 F (37.2 C)-100 F (37.8 C)] 99.4 F (37.4 C) (08/21 0512) Pulse Rate:  [77-104] 77  (08/21 0512) Resp:  [16-18] 16  (08/21 0512) BP: (104-119)/(58-75) 119/69 mmHg (08/21 0512) SpO2:  [94 %-99 %] 94 % (08/21 0512) Weight:  [228 lb 6.3 oz (103.6 kg)] 228 lb 6.3 oz (103.6 kg) (08/21 0512) Last BM Date: 08/30/11  Intake/Output from previous day: 08/20 0701 - 08/21 0700 In: 1925 [I.V.:1925] Out: -  Intake/Output this shift:    PE:  General: awake, alert, no acute distress Heart: RRR Lungs: CTA bilateral Abd: soft, less tender, +bs Ext: warm, well perfused  Lab Results:   Basename 09/03/11 0610 09/02/11 0645  WBC 14.3* 14.0*  HGB 13.7 13.5  HCT 39.4 39.9  PLT 176 160   BMET  Basename 09/03/11 0610 09/02/11 0645  NA 135 136  K 3.5 3.9  CL 97 102  CO2 25 27  GLUCOSE 97 102*  BUN 11 10  CREATININE 1.10 1.15  CALCIUM 9.2 9.0   PT/INR  Basename 09/02/11 0645  LABPROT 15.4*  INR 1.19   CMP     Component Value Date/Time   NA 135 09/03/2011 0610   K 3.5 09/03/2011 0610   CL 97 09/03/2011 0610   CO2 25 09/03/2011 0610   GLUCOSE 97 09/03/2011 0610   BUN 11 09/03/2011 0610   CREATININE 1.10 09/03/2011 0610   CALCIUM 9.2 09/03/2011 0610   PROT 6.2 09/02/2011 0645   ALBUMIN 3.1* 09/02/2011 0645   AST 11 09/02/2011 0645   ALT 11 09/02/2011 0645   ALKPHOS 58 09/02/2011 0645   BILITOT 0.9 09/02/2011 0645   GFRNONAA >90 09/03/2011 0610   GFRAA >90 09/03/2011 0610   Lipase  No results found for this basename: lipase       Studies/Results: Ct Abdomen Pelvis W Contrast  09/01/2011  *RADIOLOGY REPORT*  Clinical Data: Left lower quadrant pain.  CT ABDOMEN AND PELVIS WITH CONTRAST  Technique:  Multidetector CT imaging of the abdomen and pelvis was  performed following the standard protocol during bolus administration of intravenous contrast.  Contrast: OMNIPAQUE IOHEXOL 300 MG/ML  SOLN  Comparison: CT 10/23/2008  Findings: Diverticular changes in the left colon.  There is thickening of the lower left colon with stranding in the pericolonic fat.  In addition, there are several extraluminal gas bubbles posterior to the lower descending colon compatible with micro perforation.  Negative for abscess.  No free fluid.  Mild left lower lobe atelectasis.  No pleural effusion.  Liver and gallbladder are normal.  Pancreas spleen and kidneys are normal.  Negative for bowel obstruction.  Appendix not visualized.  IMPRESSION: Acute colitis in the lower left colon compatible with diverticulitis.  There is stranding in the pericolonic fat as well as extraluminal gas bubbles compatible with micro perforation. Negative for abscess.   Original Report Authenticated By: Camelia Phenes, M.D.     Anti-infectives: Anti-infectives     Start     Dose/Rate Route Frequency Ordered Stop   09/02/11 1130   ciprofloxacin (CIPRO) IVPB 400 mg        400 mg 200 mL/hr over 60 Minutes Intravenous Every 12 hours 09/02/11 0915     09/02/11 0800   metroNIDAZOLE (FLAGYL)  IVPB 500 mg        500 mg 100 mL/hr over 60 Minutes Intravenous Every 8 hours 09/02/11 0214     09/02/11 0300   moxifloxacin (AVELOX) IVPB 400 mg  Status:  Discontinued        400 mg 250 mL/hr over 60 Minutes Intravenous Every 24 hours 09/02/11 0214 09/02/11 0916   09/01/11 2315   metroNIDAZOLE (FLAGYL) IVPB 500 mg        500 mg 100 mL/hr over 60 Minutes Intravenous  Once 09/01/11 2302 09/02/11 0118   09/01/11 2315   ciprofloxacin (CIPRO) IVPB 400 mg        400 mg 200 mL/hr over 60 Minutes Intravenous  Once 09/01/11 2302 09/02/11 0014           Assessment/Plan  1.  Diverticulitis with microperforation: no surgical intervention at this time but likely will need colectomy in future, continue  management with IV antibiotics.  May be able to give clear liquids today.  Likely need a couple more days of IV abx.  Will consult dietician to counsel patient about diet.  Will follow.   LOS: 2 days    Aldon Hengst 09/03/2011

## 2011-09-03 NOTE — Progress Notes (Signed)
[facility] PROGRESS NOTE  Angel Rubio:[id number] DOB: September 21, 1985 DOA: 09/01/2011 PCP: No primary provider on file.  Assessment/Plan:  Diverticulitis: -Acute diverticulitis with microperforation-plan to continue IV antibiotics with ciprofloxacin and Flagyl, clear liq diet. appreciat surgery assistance.  Code Status: Full Family Communication: Mother at bedside Disposition Plan: Back home    Consultants:  [facility]  Procedures:  CT scan abdomen and pelvis  Antibiotics:  Cipro August 19  Flagyl August 19  HPI/Subjective: Pain improved. hungry  Objective: Filed Vitals:   09/02/11 0536 09/02/11 1324 09/02/11 2054 09/03/11 0512  BP: 100/61 104/58 115/75 119/69  Pulse: 81 82 104 77  Temp: 99.8 F (37.7 C) 99 F (37.2 C) 100 F (37.8 C) 99.4 F (37.4 C)  TempSrc: Oral Oral Oral Oral  Resp: 20 18 16 16   Height:      Weight:    103.6 kg (228 lb 6.3 oz)  SpO2: 94% 96% 99% 94%    Intake/Output Summary (Last 24 hours) at 09/03/11 1223 Last data filed at 09/03/11 0900  Gross per 24 hour  Intake   1925 ml  Output      0 ml  Net   1925 ml   Filed Weights   09/02/11 0222 09/03/11 0512  Weight: 103.4 kg (227 lb 15.3 oz) 103.6 kg (228 lb 6.3 oz)    Exam:   General:  Alert and oriented x3  Cardiovascular: Regular rate and rhythm  Respiratory: Clear to auscultation bilaterally  Abdomen: Soft, significant left lower quadrant tenderness  Data Reviewed: Basic Metabolic Panel:  Lab 09/03/11 8119 09/02/11 0645 09/01/11 2005  NA 135 136 138  K 3.5 3.9 4.0  CL 97 102 100  CO2 25 27 27   GLUCOSE 97 102* 114*  BUN 11 10 10   CREATININE 1.10 1.15 1.01  CALCIUM 9.2 9.0 10.1  MG -- -- --  PHOS -- -- --   Liver Function Tests:  Lab 09/02/11 0645 09/01/11 2005  AST 11 13  ALT 11 17  ALKPHOS 58 74  BILITOT 0.9 1.1  PROT 6.2 7.8  ALBUMIN 3.1* 4.1   No results found for this basename: LIPASE:5,AMYLASE:5 in the last 168  hours No results found for this basename: AMMONIA:5 in the last 168 hours CBC:  Lab 09/03/11 0610 09/02/11 0645 09/01/11 2005  WBC 14.3* 14.0* 21.7*  NEUTROABS -- -- 17.0*  HGB 13.7 13.5 15.9  HCT 39.4 39.9 45.2  MCV 89.7 89.9 88.5  PLT 176 160 202   Cardiac Enzymes: No results found for this basename: CKTOTAL:5,CKMB:5,CKMBINDEX:5,TROPONINI:5 in the last 168 hours BNP (last 3 results) No results found for this basename: PROBNP:3 in the last 8760 hours CBG: No results found for this basename: GLUCAP:5 in the last 168 hours  No results found for this or any previous visit (from the past 240 hour(s)).   Studies: Ct Abdomen Pelvis W Contrast  09/01/2011  *RADIOLOGY REPORT*  Clinical Data: Left lower quadrant pain.  CT ABDOMEN AND PELVIS WITH CONTRAST  Technique:  Multidetector CT imaging of the abdomen and pelvis was performed following the standard protocol during bolus administration of intravenous contrast.  Contrast: OMNIPAQUE IOHEXOL 300 MG/ML  SOLN  Comparison: CT 10/23/2008  Findings: Diverticular changes in the left colon.  There is thickening of the lower left colon with stranding in the pericolonic fat.  In addition, there are several extraluminal gas bubbles posterior to the lower descending colon compatible with micro perforation.  Negative for abscess.  No free  fluid.  Mild left lower lobe atelectasis.  No pleural effusion.  Liver and gallbladder are normal.  Pancreas spleen and kidneys are normal.  Negative for bowel obstruction.  Appendix not visualized.  IMPRESSION: Acute colitis in the lower left colon compatible with diverticulitis.  There is stranding in the pericolonic fat as well as extraluminal gas bubbles compatible with micro perforation. Negative for abscess.   Original Report Authenticated By: Camelia Phenes, M.D.     Scheduled Meds:    . ciprofloxacin  400 mg Intravenous Q12H  . metronidazole  500 mg Intravenous Q8H  . sodium chloride  3 mL Intravenous Q12H    Continuous Infusions:    . sodium chloride 20 mL/hr at 09/02/11 1749    Active Problems:  Diverticulitis    Time spent: 20 minutes    FELIZ [name]  [facility] Pager [phone]. If 8PM-8AM, please contact night-coverage at [email or web address], password [email or web address] 09/03/2011, 12:23 PM  LOS: 2 days

## 2011-09-03 NOTE — Progress Notes (Signed)
I reviewed the patient's CT scan with him and his mother. Slow progress. Patient examined and I agree with the assessment and plan  Violeta Gelinas, MD, MPH, FACS Pager: [phone]  09/03/2011 2:51 PM

## 2011-09-03 NOTE — Care Management Note (Signed)
    Page 1 of 1   09/05/2011     11:32:09 AM   CARE MANAGEMENT NOTE 09/05/2011  Patient:  CUNG, MASTERSON   Account Number:  [id number]  Date Initiated:  09/03/2011  Documentation initiated by:  Letha Cape  Subjective/Objective Assessment:   dx divertiuclitis with perforation  admit     Action/Plan:   Anticipated DC Date:  09/05/2011   Anticipated DC Plan:  HOME/SELF CARE      DC Planning Services  CM consult      Choice offered to / List presented to:             Status of service:  Completed, signed off Medicare Important Message given?   (If response is "NO", the following Medicare IM given date fields will be blank) Date Medicare IM given:   Date Additional Medicare IM given:    Discharge Disposition:  HOME/SELF CARE  Per UR Regulation:  Reviewed for med. necessity/level of care/duration of stay  If discussed at Long Length of Stay Meetings, dates discussed:    Comments:  PCP Lucila Maine at [facility]  09/05/11 11:30 Letha Cape RN, BSN [phone] patient for possible  dc today, No needs anticipated.  09/03/11 15:33 Letha Cape RN, BSN [phone] patient lives with room mate, pta independent.  Mom states patient may come and stay with her for a while when he is dc from hospital.  Patient is eligible for med ast if needed, but patient states he can afford his medications.

## 2011-09-04 NOTE — Progress Notes (Signed)
Gradual improvement.  Not requiring as much pain medicine.  Still tender LLQ. Clears. Patient examined and I agree with the assessment and plan  Violeta Gelinas, MD, MPH, FACS Pager: [phone]  09/04/2011 9:19 AM\

## 2011-09-04 NOTE — Progress Notes (Signed)
Patient ID: Angel Rubio, male   DOB: [birth year], 26 y.o.   MRN: [id number]     Subjective: Still with pain but continues to improve every day  Objective: Vital signs in last 24 hours: Temp:  [98.3 F (36.8 C)-98.9 F (37.2 C)] 98.3 F (36.8 C) (08/22 0447) Pulse Rate:  [61-82] 61  (08/22 0447) Resp:  [16-18] 16  (08/22 0447) BP: (104-126)/(71-85) 104/71 mmHg (08/22 0447) SpO2:  [95 %-98 %] 95 % (08/22 0447) Weight:  [225 lb 15.5 oz (102.5 kg)] 225 lb 15.5 oz (102.5 kg) (08/22 0447) Last BM Date: 08/30/11  Intake/Output from previous day: 08/21 0701 - 08/22 0700 In: 118 [P.O.:118] Out: -  Intake/Output this shift:    PE:  General: awake, alert, no acute distress Heart: RRR Lungs: CTA bilateral Abd: soft, less tender, +bs Ext: warm, well perfused  Lab Results:   Basename 09/03/11 0610 09/02/11 0645  WBC 14.3* 14.0*  HGB 13.7 13.5  HCT 39.4 39.9  PLT 176 160   BMET  Basename 09/03/11 0610 09/02/11 0645  NA 135 136  K 3.5 3.9  CL 97 102  CO2 25 27  GLUCOSE 97 102*  BUN 11 10  CREATININE 1.10 1.15  CALCIUM 9.2 9.0   PT/INR  Basename 09/02/11 0645  LABPROT 15.4*  INR 1.19   CMP     Component Value Date/Time   NA 135 09/03/2011 0610   K 3.5 09/03/2011 0610   CL 97 09/03/2011 0610   CO2 25 09/03/2011 0610   GLUCOSE 97 09/03/2011 0610   BUN 11 09/03/2011 0610   CREATININE 1.10 09/03/2011 0610   CALCIUM 9.2 09/03/2011 0610   PROT 6.2 09/02/2011 0645   ALBUMIN 3.1* 09/02/2011 0645   AST 11 09/02/2011 0645   ALT 11 09/02/2011 0645   ALKPHOS 58 09/02/2011 0645   BILITOT 0.9 09/02/2011 0645   GFRNONAA >90 09/03/2011 0610   GFRAA >90 09/03/2011 0610   Lipase  No results found for this basename: lipase       Studies/Results: No results found.  Anti-infectives: Anti-infectives     Start     Dose/Rate Route Frequency Ordered Stop   09/02/11 1130   ciprofloxacin (CIPRO) IVPB 400 mg        400 mg 200 mL/hr over 60 Minutes Intravenous Every 12 hours  09/02/11 0915     09/02/11 0800   metroNIDAZOLE (FLAGYL) IVPB 500 mg        500 mg 100 mL/hr over 60 Minutes Intravenous Every 8 hours 09/02/11 0214     09/02/11 0300   moxifloxacin (AVELOX) IVPB 400 mg  Status:  Discontinued        400 mg 250 mL/hr over 60 Minutes Intravenous Every 24 hours 09/02/11 0214 09/02/11 0916   09/01/11 2315   metroNIDAZOLE (FLAGYL) IVPB 500 mg        500 mg 100 mL/hr over 60 Minutes Intravenous  Once 09/01/11 2302 09/02/11 0118   09/01/11 2315   ciprofloxacin (CIPRO) IVPB 400 mg        400 mg 200 mL/hr over 60 Minutes Intravenous  Once 09/01/11 2302 09/02/11 0014           Assessment/Plan  1.  Diverticulitis with microperforation: no surgical intervention at this time but likely will need colectomy in future, continue management with IV antibiotics. Clear liquids today and slowly advance.  Likely need a couple more days of IV abx then home on PO for 2 weeks.  LOS: 3 days    Angel Rubio 09/04/2011

## 2011-09-04 NOTE — Plan of Care (Signed)
Problem: Phase I Progression Outcomes Goal: Initial discharge plan identified Outcome: Completed/Met Date Met:  09/04/11 To return home

## 2011-09-04 NOTE — Progress Notes (Signed)
[facility] PROGRESS NOTE  Angel Rubio WUJ:[id number] DOB: 14-Nov-1985 DOA: 09/01/2011 PCP: No primary provider on file.  Assessment/Plan:  Diverticulitis: -Acute diverticulitis with microperforation-plan to continue IV antibiotics with ciprofloxacin and Flagyl. -appreciat surgery assistance. -discomfort with diet. agrre to switching back to liq. Code Status: Full Family Communication: Mother at bedside Disposition Plan: Back home    Consultants:  [facility]  Procedures:  CT scan abdomen and pelvis  Antibiotics:  Cipro August 19  Flagyl August 19  HPI/Subjective: Pain improved.   Objective: Filed Vitals:   09/03/11 0512 09/03/11 1349 09/03/11 2035 09/04/11 0447  BP: 119/69 111/75 126/85 104/71  Pulse: 77 82 81 61  Temp: 99.4 F (37.4 C) 98.4 F (36.9 C) 98.9 F (37.2 C) 98.3 F (36.8 C)  TempSrc: Oral Oral Tympanic Oral  Resp: 16 18 16 16   Height:      Weight: 103.6 kg (228 lb 6.3 oz)   102.5 kg (225 lb 15.5 oz)  SpO2: 94% 96% 98% 95%    Intake/Output Summary (Last 24 hours) at 09/04/11 0820 Last data filed at 09/03/11 1832  Gross per 24 hour  Intake    118 ml  Output      0 ml  Net    118 ml   Filed Weights   09/02/11 0222 09/03/11 0512 09/04/11 0447  Weight: 103.4 kg (227 lb 15.3 oz) 103.6 kg (228 lb 6.3 oz) 102.5 kg (225 lb 15.5 oz)    Exam:   General:  Alert and oriented x3  Cardiovascular: Regular rate and rhythm  Respiratory: Clear to auscultation bilaterally  Abdomen: Soft, still tender left lower quadrant tenderness  Data Reviewed: Basic Metabolic Panel:  Lab 09/03/11 9562 09/02/11 0645 09/01/11 2005  NA 135 136 138  K 3.5 3.9 4.0  CL 97 102 100  CO2 25 27 27   GLUCOSE 97 102* 114*  BUN 11 10 10   CREATININE 1.10 1.15 1.01  CALCIUM 9.2 9.0 10.1  MG -- -- --  PHOS -- -- --   Liver Function Tests:  Lab 09/02/11 0645 09/01/11 2005  AST 11 13  ALT 11 17  ALKPHOS 58 74  BILITOT 0.9 1.1  PROT 6.2  7.8  ALBUMIN 3.1* 4.1   No results found for this basename: LIPASE:5,AMYLASE:5 in the last 168 hours No results found for this basename: AMMONIA:5 in the last 168 hours CBC:  Lab 09/03/11 0610 09/02/11 0645 09/01/11 2005  WBC 14.3* 14.0* 21.7*  NEUTROABS -- -- 17.0*  HGB 13.7 13.5 15.9  HCT 39.4 39.9 45.2  MCV 89.7 89.9 88.5  PLT 176 160 202   Cardiac Enzymes: No results found for this basename: CKTOTAL:5,CKMB:5,CKMBINDEX:5,TROPONINI:5 in the last 168 hours BNP (last 3 results) No results found for this basename: PROBNP:3 in the last 8760 hours CBG: No results found for this basename: GLUCAP:5 in the last 168 hours  No results found for this or any previous visit (from the past 240 hour(s)).   Studies: Ct Abdomen Pelvis W Contrast  09/01/2011  *RADIOLOGY REPORT*  Clinical Data: Left lower quadrant pain.  CT ABDOMEN AND PELVIS WITH CONTRAST  Technique:  Multidetector CT imaging of the abdomen and pelvis was performed following the standard protocol during bolus administration of intravenous contrast.  Contrast: OMNIPAQUE IOHEXOL 300 MG/ML  SOLN  Comparison: CT 10/23/2008  Findings: Diverticular changes in the left colon.  There is thickening of the lower left colon with stranding in the pericolonic fat.  In addition, there are  several extraluminal gas bubbles posterior to the lower descending colon compatible with micro perforation.  Negative for abscess.  No free fluid.  Mild left lower lobe atelectasis.  No pleural effusion.  Liver and gallbladder are normal.  Pancreas spleen and kidneys are normal.  Negative for bowel obstruction.  Appendix not visualized.  IMPRESSION: Acute colitis in the lower left colon compatible with diverticulitis.  There is stranding in the pericolonic fat as well as extraluminal gas bubbles compatible with micro perforation. Negative for abscess.   Original Report Authenticated By: Camelia Phenes, M.D.     Scheduled Meds:    . ciprofloxacin  400 mg  Intravenous Q12H  . metronidazole  500 mg Intravenous Q8H  . sodium chloride  3 mL Intravenous Q12H   Continuous Infusions:    . sodium chloride 20 mL/hr at 09/02/11 1749    Active Problems:  Diverticulitis    Time spent: 20 minutes    Angel Rubio [name]  [facility] Pager [phone]. If 8PM-8AM, please contact night-coverage at [email or web address], password [facility] 09/04/2011, 8:20 AM  LOS: 3 days

## 2011-09-05 LAB — CBC
HCT: 41 % (ref 39.0–52.0)
Hemoglobin: 14.3 g/dL (ref 13.0–17.0)
MCH: 30.8 pg (ref 26.0–34.0)
MCHC: 34.9 g/dL (ref 30.0–36.0)
MCV: 88.4 fL (ref 78.0–100.0)
Platelets: 232 10*3/uL (ref 150–400)
RBC: 4.64 MIL/uL (ref 4.22–5.81)
RDW: 12.8 % (ref 11.5–15.5)
WBC: 9.7 10*3/uL (ref 4.0–10.5)

## 2011-09-05 MED ORDER — METRONIDAZOLE 500 MG PO TABS
500.0000 mg | ORAL_TABLET | Freq: Three times a day (TID) | ORAL | Status: DC
  Administered 2011-09-05: 500 mg via ORAL
  Filled 2011-09-05 (×3): qty 1

## 2011-09-05 MED ORDER — CIPROFLOXACIN HCL 500 MG PO TABS: 500.0000 mg | ORAL_TABLET | Freq: Two times a day (BID) | ORAL | Status: AC

## 2011-09-05 MED ORDER — METRONIDAZOLE 500 MG PO TABS: 500.0000 mg | ORAL_TABLET | Freq: Three times a day (TID) | ORAL | Status: AC

## 2011-09-05 MED ORDER — CIPROFLOXACIN HCL 500 MG PO TABS
500.0000 mg | ORAL_TABLET | Freq: Two times a day (BID) | ORAL | Status: DC
  Administered 2011-09-05: 500 mg via ORAL
  Filled 2011-09-05 (×3): qty 1

## 2011-09-05 NOTE — Progress Notes (Signed)
Discharge instructions reviewed with patient verbalized and understands. Skin WNL. Prescriptions given to patient.

## 2011-09-05 NOTE — Progress Notes (Signed)
Patient ID: Angel Rubio, male   DOB: 09/27/1985, 26 y.o.   MRN: [id number] Patient ID: Angel Rubio, male   DOB: [birth year], 26 y.o.   MRN: [id number]     Subjective: Less pain and general discomfort this morning, still with LLQ tenderness. Tolerating advance in diet.   Objective: Vital signs in last 24 hours: Temp:  [97.9 F (36.6 C)-98.4 F (36.9 C)] 97.9 F (36.6 C) (08/23 0537) Pulse Rate:  [76-86] 76  (08/23 0537) Resp:  [16-20] 20  (08/23 0537) BP: (111-118)/(69-79) 111/69 mmHg (08/23 0537) SpO2:  [95 %-97 %] 97 % (08/23 0537) Weight:  [223 lb 15.8 oz (101.6 kg)] 223 lb 15.8 oz (101.6 kg) (08/23 0537) Last BM Date: 09/04/11  Intake/Output from previous day: 08/22 0701 - 08/23 0700 In: 820 [P.O.:360; I.V.:460] Out: -  Intake/Output this shift:    PE: General: awake, alert, no acute distress Heart: RRR, No M/R/G Lungs: CTA bilaterally Abd: soft, +bs, still tender in LLQ Extremities: No edema WBC has trended down to WNL, VSS, afebrile  Lab Results:   [facility] 09/05/11 0620 09/03/11 0610  WBC 9.7 14.3*  HGB 14.3 13.7  HCT 41.0 39.4  PLT 232 176   BMET  Basename 09/03/11 0610  NA 135  K 3.5  CL 97  CO2 25  GLUCOSE 97  BUN 11  CREATININE 1.10  CALCIUM 9.2   PT/INR No results found for this basename: LABPROT:2,INR:2 in the last 72 hours CMP     Component Value Date/Time   NA 135 09/03/2011 0610   K 3.5 09/03/2011 0610   CL 97 09/03/2011 0610   CO2 25 09/03/2011 0610   GLUCOSE 97 09/03/2011 0610   BUN 11 09/03/2011 0610   CREATININE 1.10 09/03/2011 0610   CALCIUM 9.2 09/03/2011 0610   PROT 6.2 09/02/2011 0645   ALBUMIN 3.1* 09/02/2011 0645   AST 11 09/02/2011 0645   ALT 11 09/02/2011 0645   ALKPHOS 58 09/02/2011 0645   BILITOT 0.9 09/02/2011 0645   GFRNONAA >90 09/03/2011 0610   GFRAA >90 09/03/2011 0610   Lipase  No results found for this basename: lipase       Studies/Results: No results found.  Anti-infectives: Anti-infectives    Start     Dose/Rate Route Frequency Ordered Stop   09/02/11 1130   ciprofloxacin (CIPRO) IVPB 400 mg        400 mg 200 mL/hr over 60 Minutes Intravenous Every 12 hours 09/02/11 0915     09/02/11 0800   metroNIDAZOLE (FLAGYL) IVPB 500 mg        500 mg 100 mL/hr over 60 Minutes Intravenous Every 8 hours 09/02/11 0214     09/02/11 0300   moxifloxacin (AVELOX) IVPB 400 mg  Status:  Discontinued        400 mg 250 mL/hr over 60 Minutes Intravenous Every 24 hours 09/02/11 0214 09/02/11 0916   09/01/11 2315   metroNIDAZOLE (FLAGYL) IVPB 500 mg        500 mg 100 mL/hr over 60 Minutes Intravenous  Once 09/01/11 2302 09/02/11 0118   09/01/11 2315   ciprofloxacin (CIPRO) IVPB 400 mg        400 mg 200 mL/hr over 60 Minutes Intravenous  Once 09/01/11 2302 09/02/11 0014           Assessment/Plan   1.  Diverticulitis with microperforation: no surgical intervention at this time but likely will need colectomy in future. 2. Continue management with IV antibiotics.  3.  Full liquids today and slowly advance.   4. Likely need one more day of IV abx then home on PO abx for 2 weeks.       LOS: 4 days    Shanisha Lech 09/05/2011

## 2011-09-05 NOTE — Progress Notes (Signed)
Improving Patient examined and I agree with the assessment and plan  Violeta Gelinas, MD, MPH, FACS Pager: [phone]  09/05/2011 3:45 PM

## 2011-09-05 NOTE — Discharge Summary (Signed)
Physician Discharge Summary  Angel Rubio XBJ:[id number] DOB: 02/01/1985 DOA: 09/01/2011  PCP: No primary provider on file.  Admit date: 09/01/2011 Discharge date: 09/05/2011  Discharge Diagnoses:  Active Problems:  Diverticulitis   Discharge Condition: stable for   Disposition:  -follow up with surgery as an outpatient. Diet:Soft diet Wt Readings from Last 3 Encounters:  09/05/11 101.6 kg (223 lb 15.8 oz)    History of present illness:  26 year old man without any significant past medical history comes with left lower quadrant pain for the last 24 hours. He was seated watching TV when the pain suddenly started and has been progressively worsening over the last 24 hours. He describes the pain as sharp, constant, severe to the scale of 9/10, radiating down to his left groin. The pain is increased by movement. It is reduced by lying still on his back and drawing his knees up. His appetite has reduced and he had one episode of large vomiting. He has not had a bowel movement for the last 2 days, but he reports passing flatus. No history of rectal bleeding. However, the patient reports passing dark colored urine without hematuria. He denies dysuria, or increased frequency. The patient does not admit to a history of trauma to his abdomen. He reports a history of fever and chills. He has a history of a left kidney stone, which she passed spontaneously 4 years ago   Hospital Course:  Acute diverticulitis: He was admitted to the hospital start on IV antibiotics CT scan of the abdomen and pelvis was done that showed acute colitis with microperforation additional results as below. He was started empirically on Cipro and Flagyl. Surgery was consulted they recommended to continue medical management. He was started on a clear liquid diet he tolerated this well was advanced to soft diet his antibiotics were changed oral, he was monitored and  he was tolerating this well so he was discharged in stable  condition. During his hospital stay he had 2 bowel movements. Discharge Exam: Filed Vitals:   09/05/11 0537  BP: 111/69  Pulse: 76  Temp: 97.9 F (36.6 C)  Resp: 20   Filed Vitals:   09/04/11 0447 09/04/11 1441 09/04/11 2058 09/05/11 0537  BP: 104/71 116/70 118/79 111/69  Pulse: 61 81 86 76  Temp: 98.3 F (36.8 C) 98.4 F (36.9 C) 98.4 F (36.9 C) 97.9 F (36.6 C)  TempSrc: Oral Oral Oral Oral  Resp: 16 16 16 20   Height:      Weight: 102.5 kg (225 lb 15.5 oz)   101.6 kg (223 lb 15.8 oz)  SpO2: 95% 95% 95% 97%   General: Awake alert and oriented x3 Cardiovascular: Regular rate and rhythm Respiratory: Good air movement clear to auscultation Abdomen: Positive bowel sounds just mild tenderness no rebound or guarding   Discharge Instructions  Discharge Orders    Future Orders Please Complete By Expires   Diet - low sodium heart healthy      Increase activity slowly        Medication List  As of 09/05/2011 10:44 AM   TAKE these medications         ciprofloxacin 500 MG tablet   Commonly known as: CIPRO   Take 1 tablet (500 mg total) by mouth 2 (two) times daily.      metroNIDAZOLE 500 MG tablet   Commonly known as: FLAGYL   Take 1 tablet (500 mg total) by mouth every 8 (eight) hours.  The results of significant diagnostics from this hospitalization (including imaging, microbiology, ancillary and laboratory) are listed below for reference.    Significant Diagnostic Studies: Ct Abdomen Pelvis W Contrast  09/01/2011  *RADIOLOGY REPORT*  Clinical Data: Left lower quadrant pain.  CT ABDOMEN AND PELVIS WITH CONTRAST  Technique:  Multidetector CT imaging of the abdomen and pelvis was performed following the standard protocol during bolus administration of intravenous contrast.  Contrast: OMNIPAQUE IOHEXOL 300 MG/ML  SOLN  Comparison: CT 10/23/2008  Findings: Diverticular changes in the left colon.  There is thickening of the lower left colon with  stranding in the pericolonic fat.  In addition, there are several extraluminal gas bubbles posterior to the lower descending colon compatible with micro perforation.  Negative for abscess.  No free fluid.  Mild left lower lobe atelectasis.  No pleural effusion.  Liver and gallbladder are normal.  Pancreas spleen and kidneys are normal.  Negative for bowel obstruction.  Appendix not visualized.  IMPRESSION: Acute colitis in the lower left colon compatible with diverticulitis.  There is stranding in the pericolonic fat as well as extraluminal gas bubbles compatible with micro perforation. Negative for abscess.   Original Report Authenticated By: Camelia Phenes, M.D.     Microbiology: No results found for this or any previous visit (from the past 240 hour(s)).   Labs: Basic Metabolic Panel:  Lab 09/03/11 1610 09/02/11 0645 09/01/11 2005  NA 135 136 138  K 3.5 3.9 --  CL 97 102 100  CO2 25 27 27   GLUCOSE 97 102* 114*  BUN 11 10 10   CREATININE 1.10 1.15 1.01  CALCIUM 9.2 9.0 10.1  MG -- -- --  PHOS -- -- --   Liver Function Tests:  Lab 09/02/11 0645 09/01/11 2005  AST 11 13  ALT 11 17  ALKPHOS 58 74  BILITOT 0.9 1.1  PROT 6.2 7.8  ALBUMIN 3.1* 4.1   No results found for this basename: LIPASE:5,AMYLASE:5 in the last 168 hours No results found for this basename: AMMONIA:5 in the last 168 hours CBC:  Lab 09/05/11 0620 09/03/11 0610 09/02/11 0645 09/01/11 2005  WBC 9.7 14.3* 14.0* 21.7*  NEUTROABS -- -- -- 17.0*  HGB 14.3 13.7 13.5 15.9  HCT 41.0 39.4 39.9 45.2  MCV 88.4 89.7 89.9 88.5  PLT 232 176 160 202   Cardiac Enzymes: No results found for this basename: CKTOTAL:5,CKMB:5,CKMBINDEX:5,TROPONINI:5 in the last 168 hours BNP: No components found with this basename: POCBNP:5 CBG: No results found for this basename: GLUCAP:5 in the last 168 hours  Time coordinating discharge: Greater than 35 minutes  Signed:  Marinda Elk  [facility] 09/05/2011,  10:44 AM

## 2013-04-05 IMAGING — CT CT ABD-PELV W/ CM
2 of 4 series · 13 of 42 positions shown, 19 images · IV contrast (omnipaque)
Comparison: CT 10/23/2008

CLINICAL DATA: Left lower quadrant pain.

CT ABDOMEN AND PELVIS WITH CONTRAST
TECHNIQUE: Multidetector CT imaging of the abdomen and pelvis was
performed following the standard protocol during bolus
administration of intravenous contrast.
Contrast: 100mL OMNIPAQUE IOHEXOL 300 MG/ML  SOLN

[Series 2: routine abdomen · axial · 0.75mm/px · z∈[-424,-59]mm · 10 of 88 slices shown, 16 images]
[im 8/88  soft-tissue]
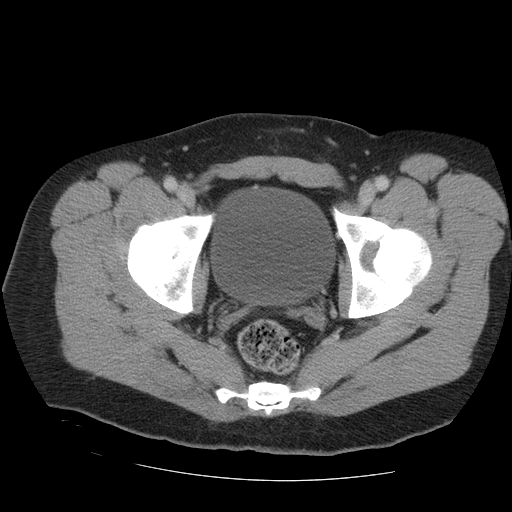
[im 8/88  bone]
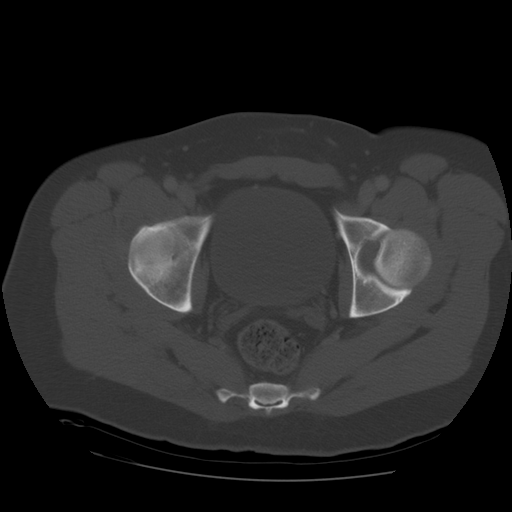
[im 15/88  soft-tissue]
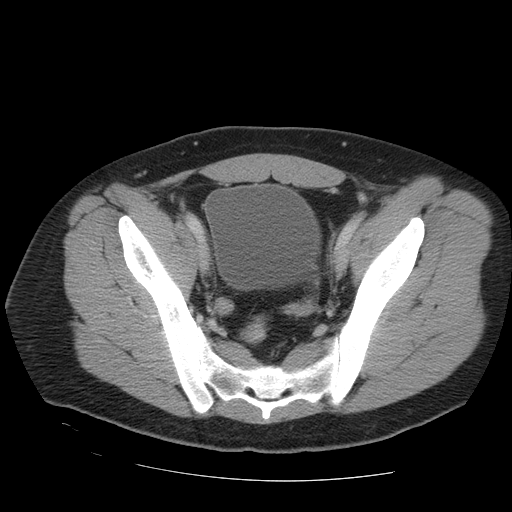
[im 22/88  soft-tissue]
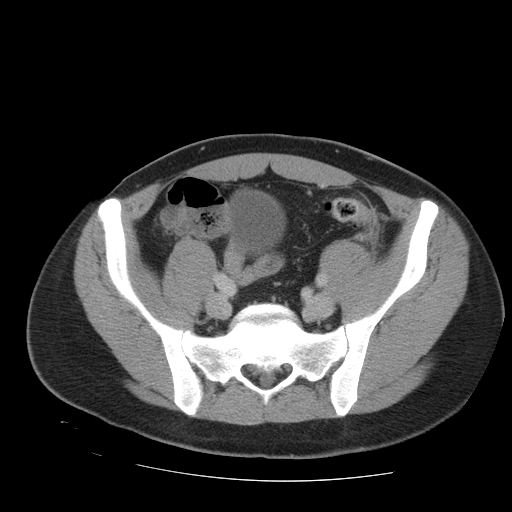
[im 30/88  soft-tissue]
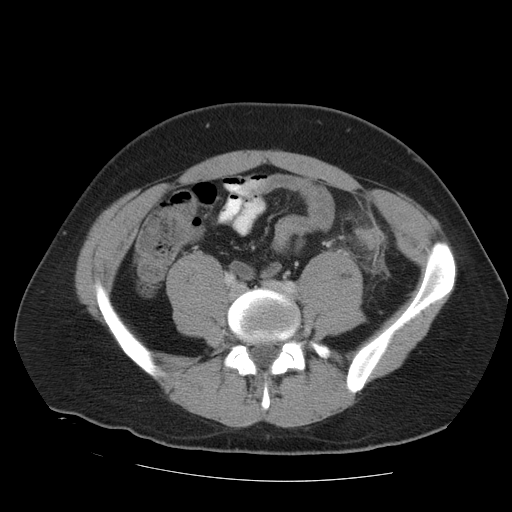
[im 37/88  soft-tissue]
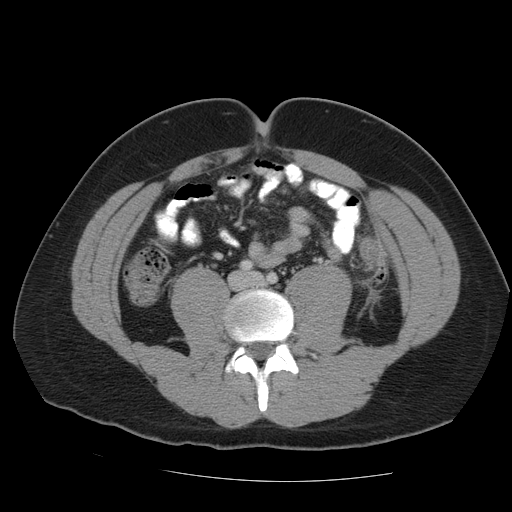
[im 51/88  soft-tissue]
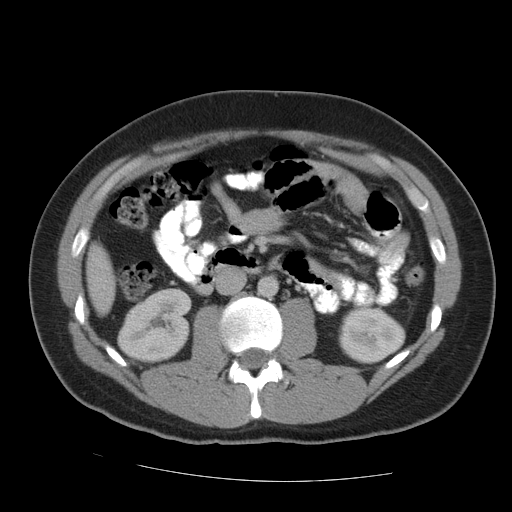
[im 59/88  soft-tissue]
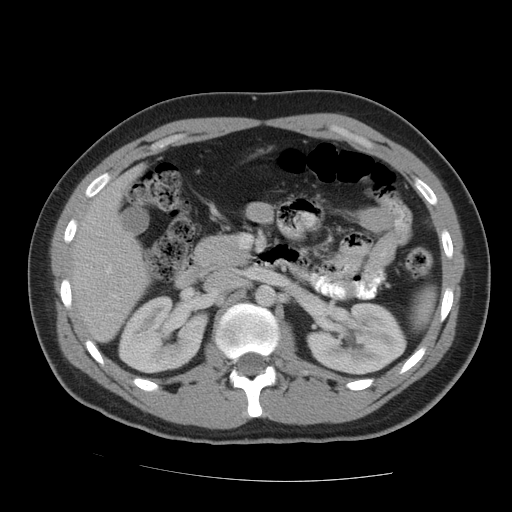
[im 59/88  lung]
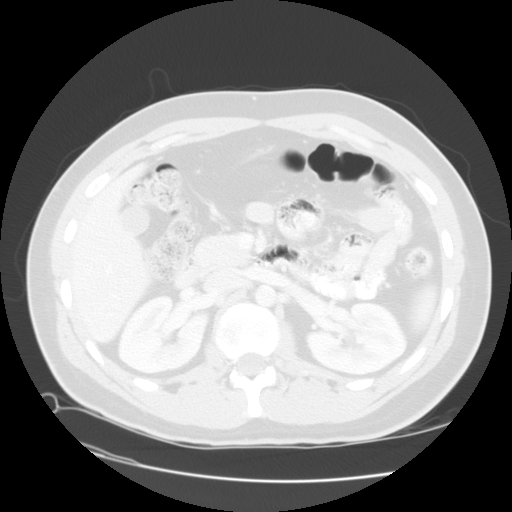
[im 66/88  soft-tissue]
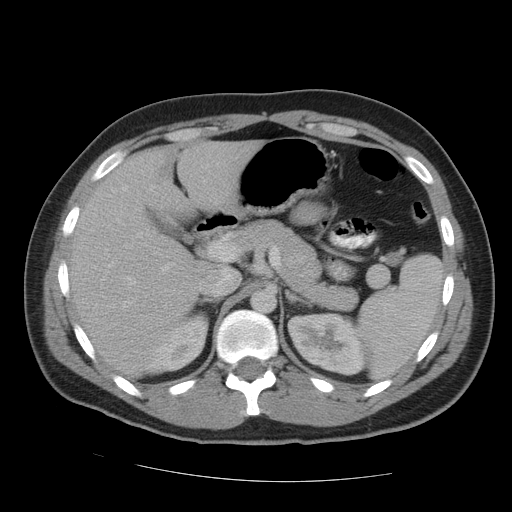
[im 66/88  lung]
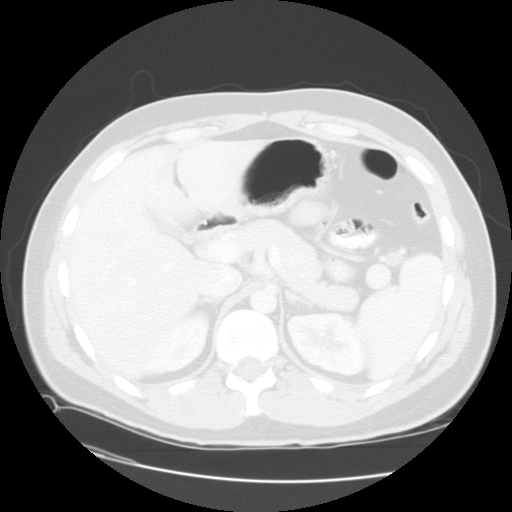
[im 73/88  soft-tissue]
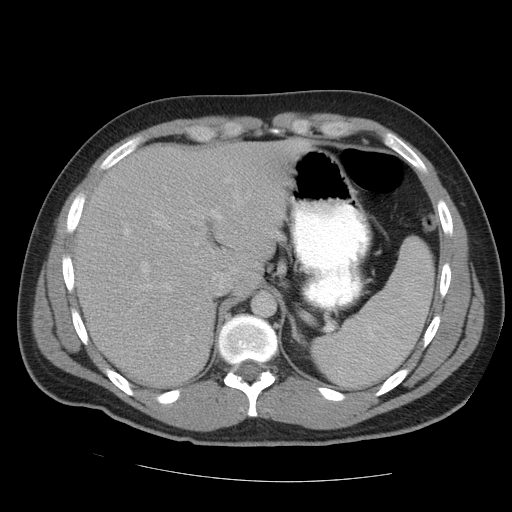
[im 73/88  lung]
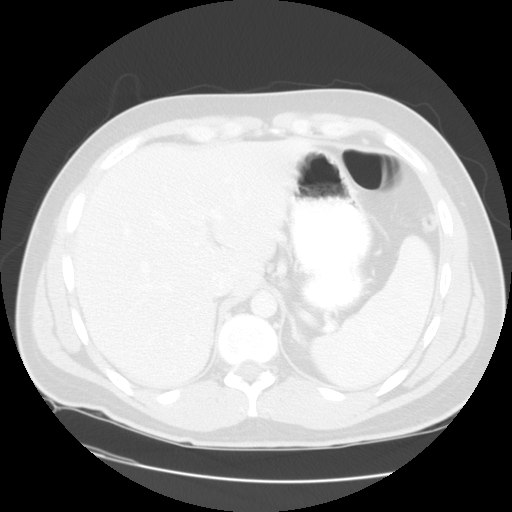
[im 73/88  bone]
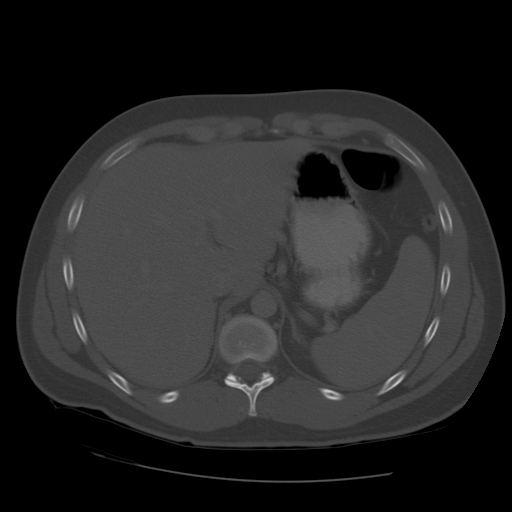
[im 80/88  soft-tissue]
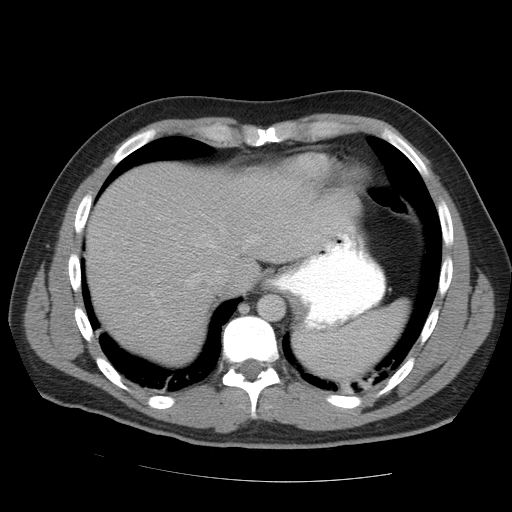
[im 80/88  lung]
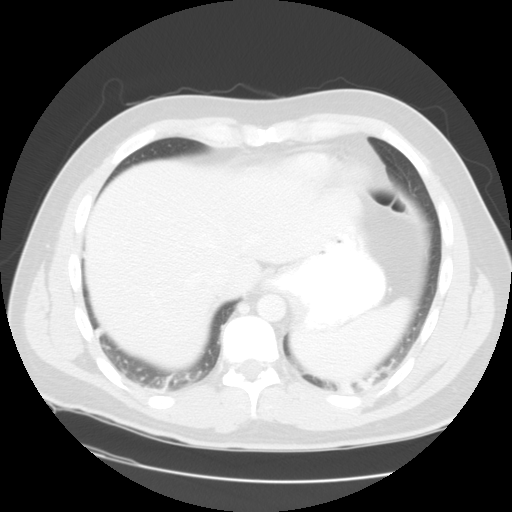

[Series 400: cor · coronal · 1.02mm/px · 3 of 87 slices shown]
[im 29/87  soft-tissue]
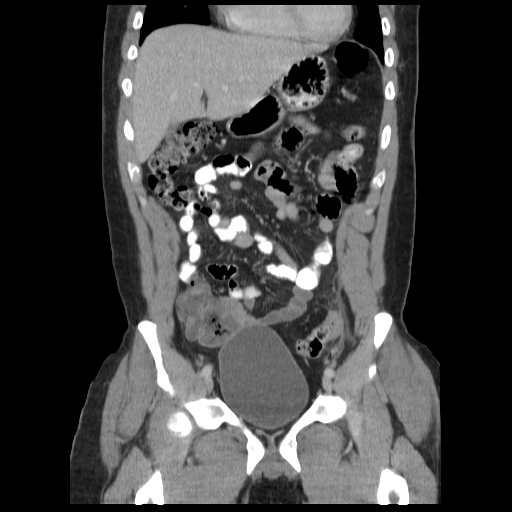
[im 39/87  soft-tissue]
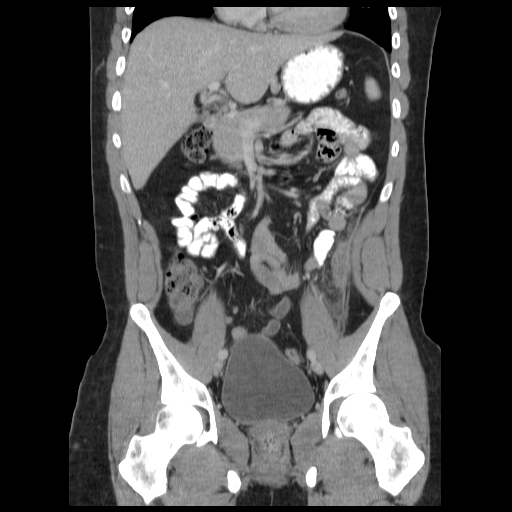
[im 48/87  soft-tissue]
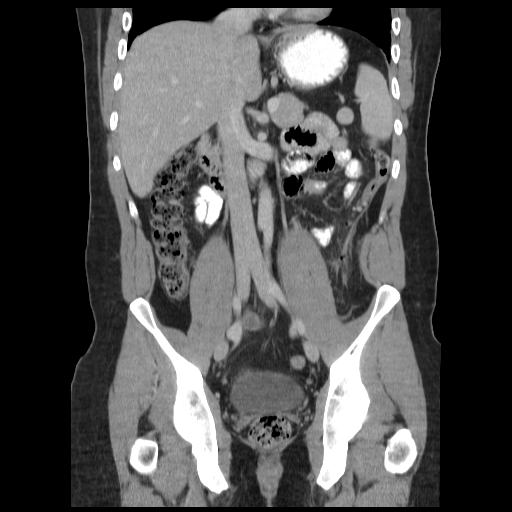

[13 of 42 positions shown; findings below may reference images not displayed]

FINDINGS: Diverticular changes in the left colon.  There is
thickening of the lower left colon with stranding in the
pericolonic fat.  In addition, there are several extraluminal gas
bubbles posterior to the lower descending colon compatible with
micro perforation.  Negative for abscess.  No free fluid.

Mild left lower lobe atelectasis.  No pleural effusion.  Liver and
gallbladder are normal.  Pancreas spleen and kidneys are normal.

Negative for bowel obstruction.  Appendix not visualized.
IMPRESSION: Acute colitis in the lower left colon compatible with
diverticulitis.  There is stranding in the pericolonic fat as well
as extraluminal gas bubbles compatible with micro perforation.
Negative for abscess.

## 2016-10-11 ENCOUNTER — Ambulatory Visit
Admission: EM | Admit: 2016-10-11 | Discharge: 2016-10-11 | Disposition: A | Discharge status: Home / Self Care | Payer: Self-pay | Attending: Family Medicine | Admitting: Family Medicine

## 2016-10-11 DIAGNOSIS — R109 Unspecified abdominal pain: Secondary | ICD-10-CM

## 2016-10-11 DIAGNOSIS — R319 Hematuria, unspecified: Secondary | ICD-10-CM

## 2016-10-11 HISTORY — DX: Diverticulitis of intestine, part unspecified, without perforation or abscess without bleeding: K57.92

## 2016-10-11 LAB — URINALYSIS, COMPLETE (UACMP) WITH MICROSCOPIC
BILIRUBIN URINE: NEGATIVE
Bacteria, UA: NONE SEEN
GLUCOSE, UA: NEGATIVE mg/dL
KETONES UR: 40 mg/dL — AB
Leukocytes, UA: NEGATIVE
NITRITE: NEGATIVE
PH: 7 (ref 5.0–8.0)
Protein, ur: NEGATIVE mg/dL
SQUAMOUS EPITHELIAL / LPF: NONE SEEN
Specific Gravity, Urine: 1.025 (ref 1.005–1.030)
WBC, UA: NONE SEEN WBC/hpf (ref 0–5)

## 2016-10-11 MED ORDER — HYDROCODONE-ACETAMINOPHEN 5-325 MG PO TABS: 2.0000 | ORAL_TABLET | ORAL | 0 refills | Status: AC | PRN

## 2016-10-11 MED ORDER — MELOXICAM 15 MG PO TABS: 15.0000 mg | ORAL_TABLET | Freq: Every day | ORAL | 0 refills | Status: AC | PRN

## 2016-10-11 MED ORDER — ONDANSETRON 8 MG PO TBDP
8.0000 mg | ORAL_TABLET | Freq: Once | ORAL | Status: AC
  Administered 2016-10-11: 8 mg via ORAL

## 2016-10-11 MED ORDER — KETOROLAC TROMETHAMINE 60 MG/2ML IM SOLN
60.0000 mg | Freq: Once | INTRAMUSCULAR | Status: AC
  Administered 2016-10-11: 60 mg via INTRAMUSCULAR

## 2016-10-11 NOTE — ED Triage Notes (Signed)
Pt with hx of kidney stones and states this is consistent with past kidney stones. Left flank pain and vomiting starting this a.m. Pain 3/10

## 2016-10-11 NOTE — ED Provider Notes (Signed)
[facility]    CSN: [id number] Arrival date & time: 10/11/16  1127     History   Chief Complaint Chief Complaint  Patient presents with  . Flank Pain    HPI [name] is a 31 y.o. male.   Patient is a 31 year old male who presents with complaint of kidney stone type pain that began about 8 AM. Patient reports she had a kidney stone last in 2010 his pain is similar to then. Patient reports pain the left lower back radiating around to the front and down to the left testicle. Patient were pain 10 out of 10. Patient lying on a stretcher on his right side with knees drawn. Patient reports not taking any pain medication home. Patient states that his last stone was not tested because he was unable to catch it. Patient reports his last urination was about 8:30-9:00 this AM. He states that he has not been able to drink anything due to the pain.      Past Medical History:  Diagnosis Date  . Diverticulitis   . Kidney stone     Patient Active Problem List   Diagnosis Date Noted  . Diverticulitis 09/02/2011    Past Surgical History:  Procedure Laterality Date  . SALIVARY GLAND SURGERY  2001   Biopsy=>negative  . SHOULDER SURGERY     Right  . TONSILLECTOMY         Home Medications    Prior to Admission medications   Medication Sig Start Date End Date Taking? Authorizing Provider  HYDROcodone-acetaminophen (NORCO/VICODIN) 5-325 MG tablet Take 2 tablets by mouth every 4 (four) hours as needed. 10/11/16   Candis Schatz, PA-C  meloxicam (MOBIC) 15 MG tablet Take 1 tablet (15 mg total) by mouth daily as needed for pain. 10/11/16   Candis Schatz, PA-C    Family History Family History  Problem Relation Age of Onset  . Crohn's disease Mother   . Hypertension Father     Social History Social History  Substance Use Topics  . Smoking status: Never Smoker  . Smokeless tobacco: Current User    Types: Chew  . Alcohol use 3.0 oz/week    6 Standard  drinks or equivalent per week     Comment: social     Allergies   Patient has no known allergies.   Review of Systems Review of Systems  As noted above in history of present illness. Other systems reviewed and found to be negative.   Physical Exam Triage Vital Signs ED Triage Vitals  Enc Vitals Group     BP 10/11/16 1224 130/82     Pulse Rate 10/11/16 1224 60     Resp 10/11/16 1224 18     Temp 10/11/16 1224 97.9 F (36.6 C)     Temp Source 10/11/16 1224 Oral     SpO2 10/11/16 1224 100 %     Weight 10/11/16 1221 235 lb (106.6 kg)     Height 10/11/16 1221 6' (1.829 m)     Head Circumference --      Peak Flow --      Pain Score 10/11/16 1221 3     Pain Loc --      Pain Edu? --      Excl. in GC? --    No data found.   Updated Vital Signs BP 130/82 (BP Location: Left Arm)   Pulse 60   Temp 97.9 F (36.6 C) (Oral)   Resp 18  Ht 6' (1.829 m)   Wt 235 lb (106.6 kg)   SpO2 100%   BMI 31.87 kg/m   Physical Exam  Constitutional: He is oriented to person, place, and time. He appears well-developed and well-nourished.  Moderate distress related to pain. Patient lying on right side with knees drawn.  HENT:  Head: Normocephalic and atraumatic.  Eyes: Pupils are equal, round, and reactive to light. EOM are normal.  Neck: Normal range of motion. Neck supple.  Cardiovascular: Normal rate, regular rhythm and normal heart sounds.  Exam reveals no friction rub.   No murmur heard. Pulmonary/Chest: Effort normal and breath sounds normal.  Abdominal: Soft. Bowel sounds are normal.  Left CVA, flank, and left lower quadrant tenderness that increases with palpation. No abdominal distention.  Musculoskeletal: Normal range of motion.  Neurological: He is alert and oriented to person, place, and time. No cranial nerve deficit.  Skin: Skin is warm and dry.     UC Treatments / Results  Labs (all labs ordered are listed, but only abnormal results are displayed) Labs Reviewed    URINALYSIS, COMPLETE (UACMP) WITH MICROSCOPIC - Abnormal; Notable for the following:       Result Value   APPearance HAZY (*)    Hgb urine dipstick TRACE (*)    Ketones, ur 40 (*)    All other components within normal limits    EKG  EKG Interpretation None       Radiology No results found.  Procedures Procedures (including critical care time)  Medications Ordered in UC Medications  ketorolac (TORADOL) injection 60 mg (60 mg Intramuscular Given 10/11/16 1242)  ondansetron (ZOFRAN-ODT) disintegrating tablet 8 mg (8 mg Oral Given 10/11/16 1246)     Initial Impression / Assessment and Plan / UC Course  I have reviewed the triage vital signs and the nursing notes.  Pertinent labs & imaging results that were available during my care of the patient were reviewed by me and considered in my medical decision making (see chart for details).    Patient given Toradol for his pain and Zofran for nausea. Next  Patient presents with symptoms of left kidney stone. Patient last stone in 2010. CT from that time showed a 3 mm stone with mild hydronephrosis. The hydronephrosis developed you with the patient presenting shortly after the pain began. Will check a urinalysis. Also give him medications for pain and recommend pushing the fluids. CT is not available here at this time.    Final Clinical Impressions(s) / UC Diagnoses   Final diagnoses:  Left flank pain  Hematuria, unspecified type   Urinalysis trace hemoglobin and ketones of 40. Given symptoms and hematuria likely patient has a kidney stones given his history. Will have patient push fluids, not only for the stoma for his urinalysis is well, and straining his urine. We'll advise patient if his stone has not passed by Monday that he needs presents to the ER for further evaluation including CAT scan. This regard to patient having a possible large stone or other condition causing his pain. In addition, patient does have a history of mild  hydronephrosis with only a 3 mm size stone. Patient was given prescription for Mobic and Norco for pain. Patient verbalized understanding is removed with plan.  New Prescriptions New Prescriptions   HYDROCODONE-ACETAMINOPHEN (NORCO/VICODIN) 5-325 MG TABLET    Take 2 tablets by mouth every 4 (four) hours as needed.   MELOXICAM (MOBIC) 15 MG TABLET    Take 1 tablet (15 mg total)  by mouth daily as needed for pain.      Controlled Substance Prescriptions [place] Controlled Substance Registry consulted? Yes, I have consulted the [place] Controlled Substances Registry for this patient, and feel the risk/benefit ratio today is favorable for proceeding with this prescription for a controlled substance.  Candis Schatz, PA-C    Candis Schatz, PA-C 10/11/16 1436

## 2016-10-11 NOTE — Discharge Instructions (Signed)
-  push fluids, urine showed likely decreased fluid intake -stain all urine -if stone has not passed by Monday, need to report to ER for CT scan to rule out a large stone that needs to be removed per urology or other condition causing the pain -Mobic: one tablet daily as needed for pain. Do not take other similar medication (ibuprofen, naproxen) while taking this medication -Norco: one tablet every 6 hours as needed for pain not relieved with Mobic.
